# Patient Record
Sex: Male | Born: 1960 | Race: Black or African American | Hispanic: No | Marital: Single
Health system: Southern US, Community
[De-identification: ages and names within clinical notes are randomized; demographics above are authoritative.]

## PROBLEM LIST (undated history)

## (undated) DIAGNOSIS — K279 Peptic ulcer, site unspecified, unspecified as acute or chronic, without hemorrhage or perforation: Secondary | ICD-10-CM

---

## 1998-07-02 ENCOUNTER — Emergency Department (HOSPITAL_COMMUNITY): Admission: EM | Admit: 1998-07-02 | Discharge: 1998-07-02 | Payer: Self-pay | Admitting: Emergency Medicine

## 1998-07-08 ENCOUNTER — Emergency Department (HOSPITAL_COMMUNITY): Admission: EM | Admit: 1998-07-08 | Discharge: 1998-07-08 | Payer: Self-pay | Admitting: Emergency Medicine

## 1998-11-09 ENCOUNTER — Emergency Department (HOSPITAL_COMMUNITY): Admission: EM | Admit: 1998-11-09 | Discharge: 1998-11-09 | Payer: Self-pay | Admitting: Emergency Medicine

## 1998-11-09 ENCOUNTER — Encounter: Payer: Self-pay | Admitting: Emergency Medicine

## 2000-07-11 ENCOUNTER — Emergency Department (HOSPITAL_COMMUNITY): Admission: EM | Admit: 2000-07-11 | Discharge: 2000-07-11 | Payer: Self-pay | Admitting: Emergency Medicine

## 2000-07-11 ENCOUNTER — Encounter: Payer: Self-pay | Admitting: Emergency Medicine

## 2006-12-17 ENCOUNTER — Emergency Department (HOSPITAL_COMMUNITY): Admission: EM | Admit: 2006-12-17 | Discharge: 2006-12-17 | Payer: Self-pay | Admitting: Emergency Medicine

## 2007-07-21 ENCOUNTER — Emergency Department (HOSPITAL_COMMUNITY): Admission: EM | Admit: 2007-07-21 | Discharge: 2007-07-21 | Payer: Self-pay | Admitting: Emergency Medicine

## 2009-01-03 ENCOUNTER — Emergency Department (HOSPITAL_COMMUNITY): Admission: EM | Admit: 2009-01-03 | Discharge: 2009-01-03 | Payer: Self-pay | Admitting: Emergency Medicine

## 2009-01-20 ENCOUNTER — Ambulatory Visit: Payer: Self-pay | Admitting: Critical Care Medicine

## 2009-01-20 ENCOUNTER — Inpatient Hospital Stay (HOSPITAL_COMMUNITY): Admission: EM | Admit: 2009-01-20 | Discharge: 2009-01-24 | Payer: Self-pay | Admitting: Emergency Medicine

## 2009-01-24 ENCOUNTER — Encounter: Payer: Self-pay | Admitting: Internal Medicine

## 2009-01-28 DIAGNOSIS — J189 Pneumonia, unspecified organism: Secondary | ICD-10-CM

## 2009-01-28 DIAGNOSIS — Z9189 Other specified personal risk factors, not elsewhere classified: Secondary | ICD-10-CM | POA: Insufficient documentation

## 2009-01-28 DIAGNOSIS — D539 Nutritional anemia, unspecified: Secondary | ICD-10-CM | POA: Insufficient documentation

## 2009-01-28 DIAGNOSIS — F1010 Alcohol abuse, uncomplicated: Secondary | ICD-10-CM | POA: Insufficient documentation

## 2009-01-28 DIAGNOSIS — F1910 Other psychoactive substance abuse, uncomplicated: Secondary | ICD-10-CM

## 2009-01-28 DIAGNOSIS — K279 Peptic ulcer, site unspecified, unspecified as acute or chronic, without hemorrhage or perforation: Secondary | ICD-10-CM | POA: Insufficient documentation

## 2009-01-28 DIAGNOSIS — F17200 Nicotine dependence, unspecified, uncomplicated: Secondary | ICD-10-CM

## 2009-01-29 ENCOUNTER — Ambulatory Visit: Payer: Self-pay | Admitting: Pulmonary Disease

## 2009-01-29 DIAGNOSIS — J852 Abscess of lung without pneumonia: Secondary | ICD-10-CM | POA: Insufficient documentation

## 2009-02-11 ENCOUNTER — Ambulatory Visit: Payer: Self-pay | Admitting: Pulmonary Disease

## 2009-02-14 ENCOUNTER — Encounter: Payer: Self-pay | Admitting: Pulmonary Disease

## 2009-06-18 ENCOUNTER — Emergency Department (HOSPITAL_COMMUNITY): Admission: EM | Admit: 2009-06-18 | Discharge: 2009-06-18 | Payer: Self-pay | Admitting: Emergency Medicine

## 2010-05-18 ENCOUNTER — Emergency Department (HOSPITAL_COMMUNITY)
Admission: EM | Admit: 2010-05-18 | Discharge: 2010-05-19 | Disposition: A | Discharge status: Other Acute Inpatient Hospital | Payer: Self-pay | Source: Home / Self Care | Admitting: Emergency Medicine

## 2010-05-19 ENCOUNTER — Inpatient Hospital Stay (HOSPITAL_COMMUNITY)
Admission: AD | Admit: 2010-05-19 | Discharge: 2010-05-23 | Payer: Self-pay | Source: Home / Self Care | Attending: Psychiatry | Admitting: Psychiatry

## 2010-08-10 LAB — HEPATIC FUNCTION PANEL
Alkaline Phosphatase: 48 U/L (ref 39–117)
Bilirubin, Direct: 0.1 mg/dL (ref 0.0–0.3)
Total Bilirubin: 0.7 mg/dL (ref 0.3–1.2)

## 2010-08-10 LAB — BASIC METABOLIC PANEL
BUN: 6 mg/dL (ref 6–23)
CO2: 24 mEq/L (ref 19–32)
Chloride: 98 mEq/L (ref 96–112)
GFR calc non Af Amer: >60 mL/min (ref >60)
Glucose, Bld: 106 mg/dL — ABNORMAL HIGH (ref 70–99)
Potassium: 4.1 mEq/L (ref 3.5–5.1)

## 2010-08-10 LAB — RAPID URINE DRUG SCREEN, HOSP PERFORMED
Cocaine: POSITIVE — AB
Opiates: NOT DETECTED
Tetrahydrocannabinol: POSITIVE — AB

## 2010-08-10 LAB — POCT CARDIAC MARKERS

## 2010-08-10 LAB — CBC
HCT: 43.4 % (ref 39.0–52.0)
MCH: 32.1 pg (ref 26.0–34.0)
MCHC: 34.6 g/dL (ref 30.0–36.0)
MCV: 92.9 fL (ref 78.0–100.0)
RDW: 14.5 % (ref 11.5–15.5)

## 2010-08-10 LAB — DIFFERENTIAL
Basophils Absolute: 0 10*3/uL (ref 0.0–0.1)
Basophils Relative: 0 % (ref 0–1)
Eosinophils Absolute: 0 10*3/uL (ref 0.0–0.7)
Eosinophils Relative: 0 % (ref 0–5)
Monocytes Absolute: 0.5 10*3/uL (ref 0.1–1.0)

## 2010-08-10 LAB — SALICYLATE LEVEL: Salicylate Lvl: <4 mg/dL (ref 2.8–20.0)

## 2010-08-16 LAB — BASIC METABOLIC PANEL
BUN: 12 mg/dL (ref 6–23)
CO2: 24 mEq/L (ref 19–32)
GFR calc non Af Amer: >60 mL/min (ref >60)
Glucose, Bld: 108 mg/dL — ABNORMAL HIGH (ref 70–99)
Potassium: 3.4 mEq/L — ABNORMAL LOW (ref 3.5–5.1)

## 2010-08-16 LAB — URINE CULTURE
Colony Count: NO GROWTH
Culture: NO GROWTH

## 2010-08-16 LAB — CULTURE, BLOOD (ROUTINE X 2): Culture: NO GROWTH

## 2010-08-16 LAB — URINALYSIS, ROUTINE W REFLEX MICROSCOPIC
Bilirubin Urine: NEGATIVE
Hgb urine dipstick: NEGATIVE
Nitrite: NEGATIVE
Specific Gravity, Urine: 1.04 — ABNORMAL HIGH (ref 1.005–1.030)
pH: 6 (ref 5.0–8.0)

## 2010-08-16 LAB — DIFFERENTIAL
Basophils Absolute: 0 10*3/uL (ref 0.0–0.1)
Basophils Relative: 0 % (ref 0–1)
Eosinophils Absolute: 0 10*3/uL (ref 0.0–0.7)
Eosinophils Relative: 0 % (ref 0–5)
Lymphocytes Relative: 12 % (ref 12–46)
Monocytes Absolute: 0.6 10*3/uL (ref 0.1–1.0)

## 2010-08-16 LAB — CBC
HCT: 41.2 % (ref 39.0–52.0)
Hemoglobin: 13.8 g/dL (ref 13.0–17.0)
MCHC: 33.5 g/dL (ref 30.0–36.0)
Platelets: 233 10*3/uL (ref 150–400)
RDW: 15.3 % (ref 11.5–15.5)

## 2010-08-16 LAB — URINE MICROSCOPIC-ADD ON

## 2010-08-19 IMAGING — CR DG CHEST 2V
2 series · 2 of 2 positions shown · non-contrast
Comparison: None

CLINICAL DATA: Preadmission.  Elevated white count.  Cough.  Fever.
Bronchitis.  Smoker.

CHEST - 2 VIEW

[w chest pa]
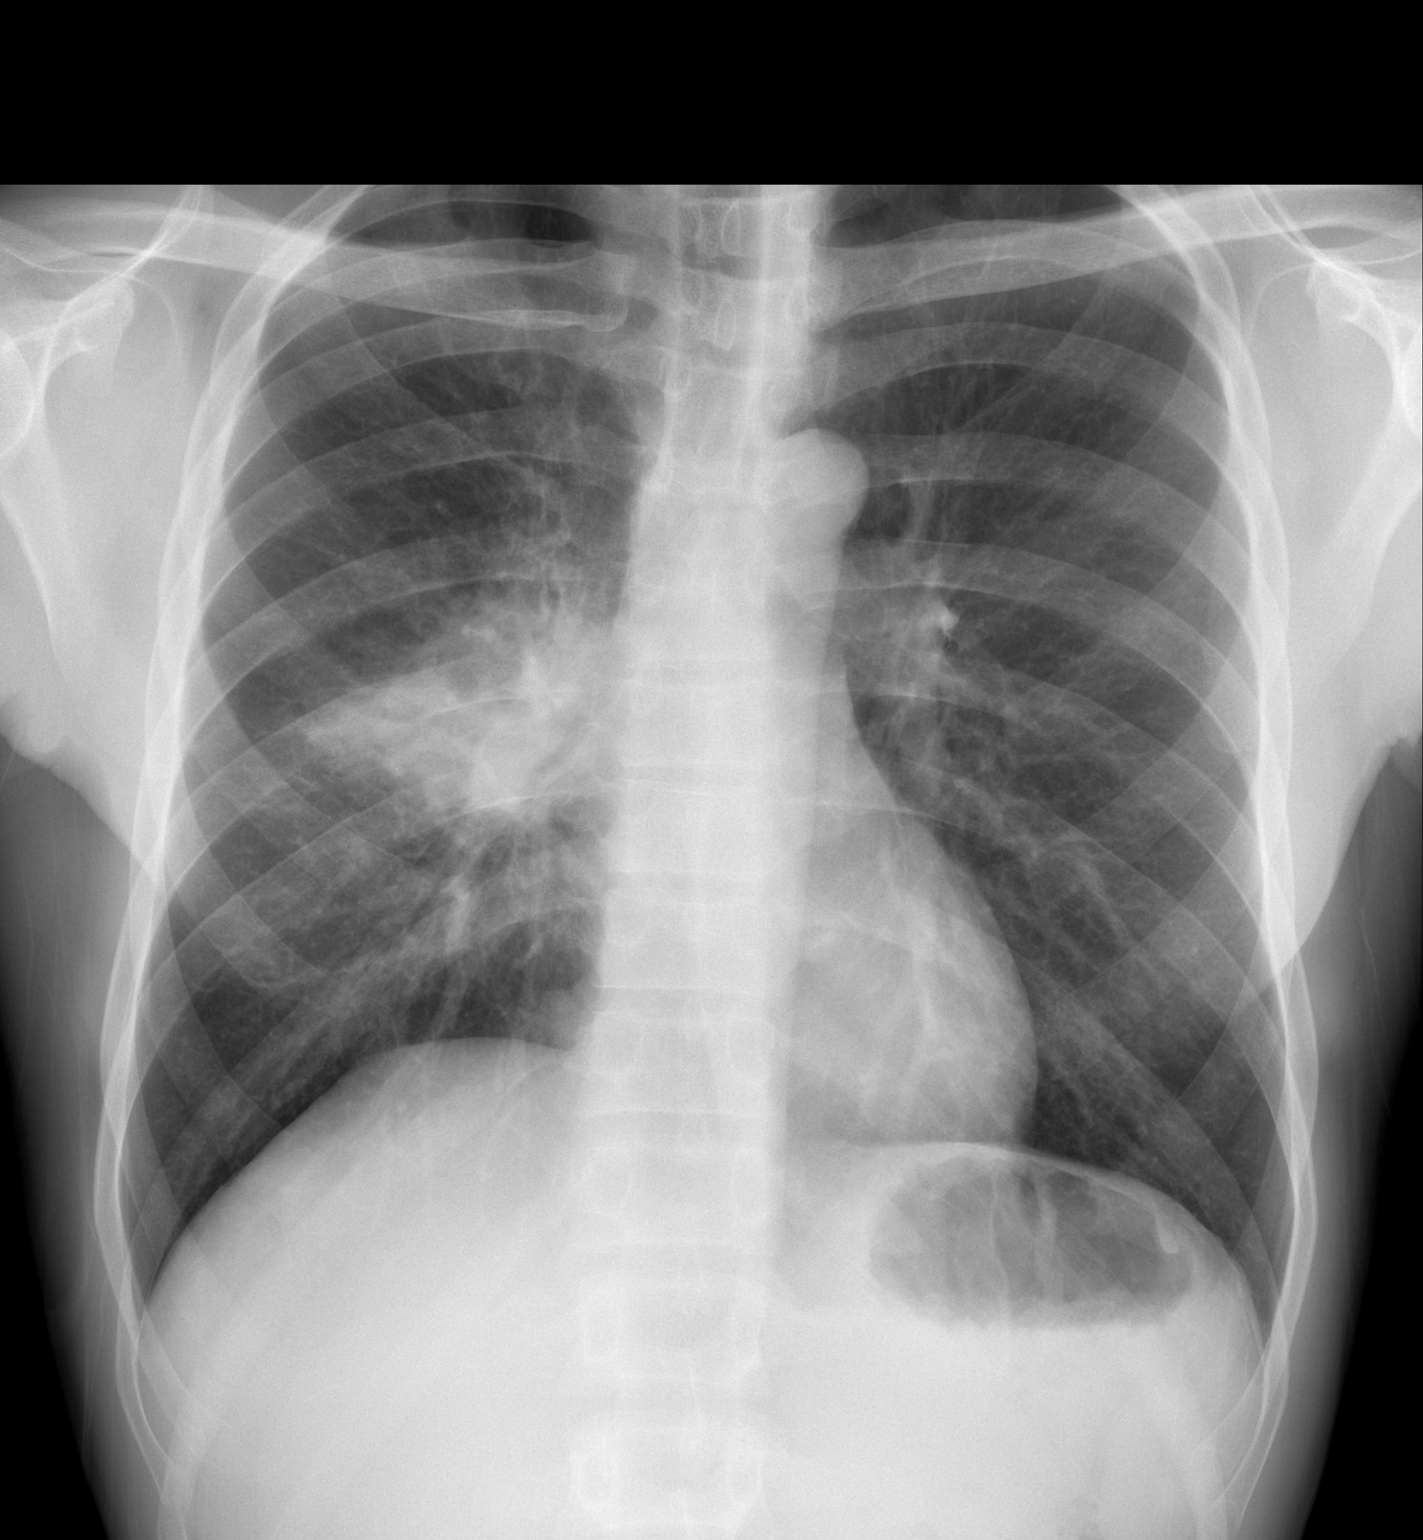

[w chest lat]
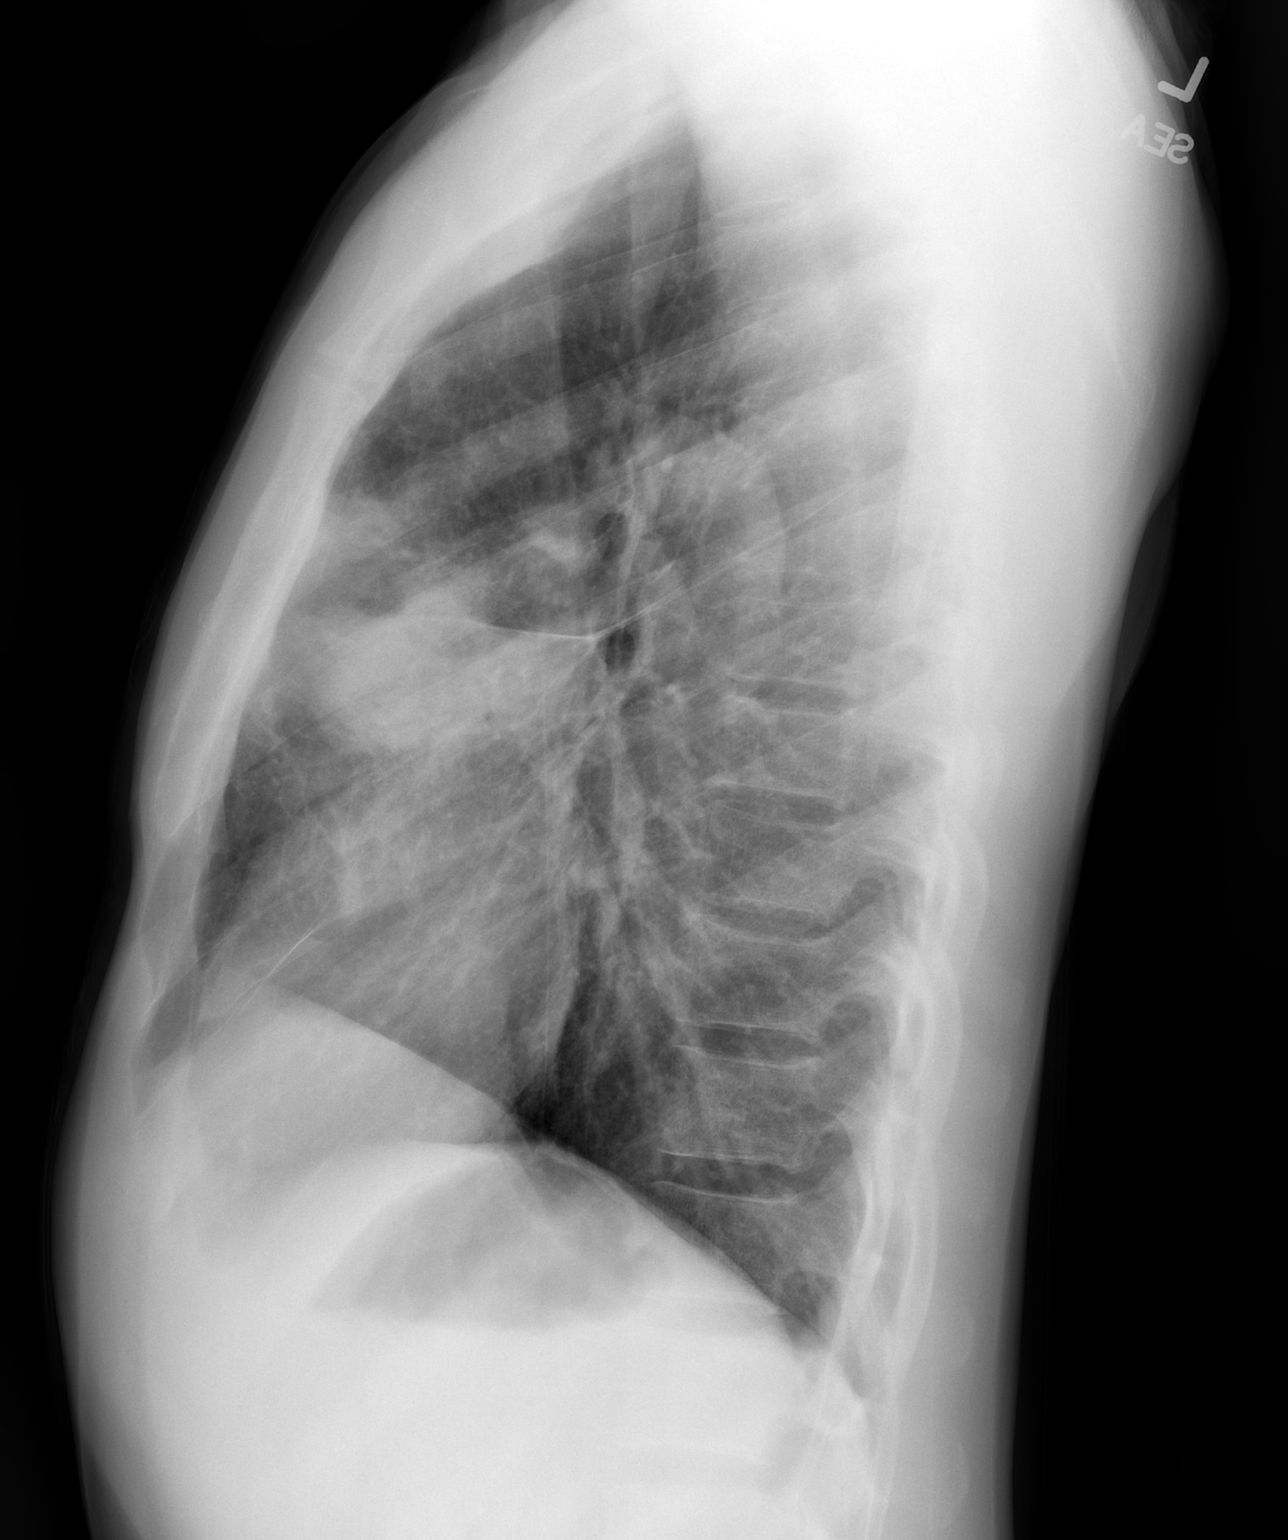

[2 of 2 positions shown; findings below may reference images not displayed]

FINDINGS: Homogeneous opacity anteromedially in the right mid
chest.  Main considerations are a pneumonic consolidation versus
tumor.

Normal cardiac size and shape.  No pleural effusion.  Bony thorax
is intact.
IMPRESSION: Right middle and upper lobe opacity of indeterminate etiology.
Although this may represent pneumonia with consolidation, tumor
cannot be excluded at this point.  Recommend follow-up CXR to
assess for complete clearing.

## 2010-08-20 IMAGING — CT CT CHEST W/O CM
3 series · 18 of 29 positions shown, 19 images · non-contrast
Comparison: Chest radiographs 01/20/2009.

CLINICAL DATA: Chest pain with fever and leukocytosis.

CT CHEST WITHOUT CONTRAST
TECHNIQUE: Multidetector CT imaging of the chest was performed
following the standard protocol without IV contrast.

[Series 2: routine chest · axial · 0.70mm/px · z∈[-226,-66]mm · 4 of 66 slices shown, 5 images]
[im 17/66  mediastinal]
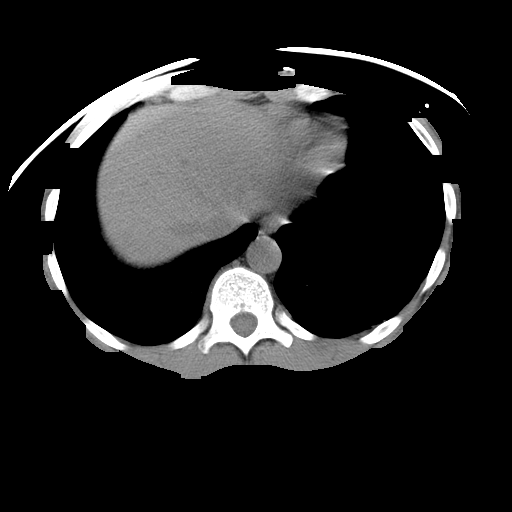
[im 17/66  lung]
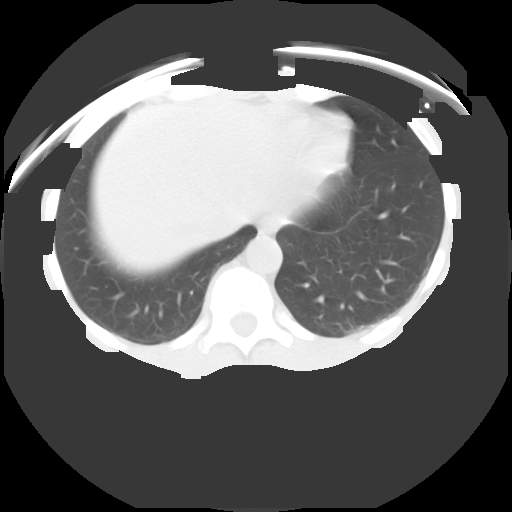
[im 33/66  lung]
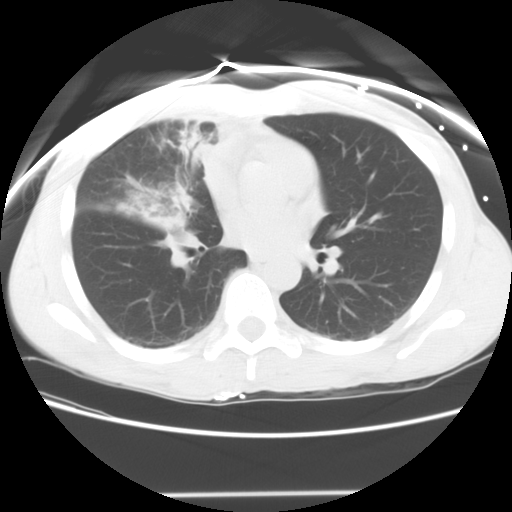
[im 34/66  lung]
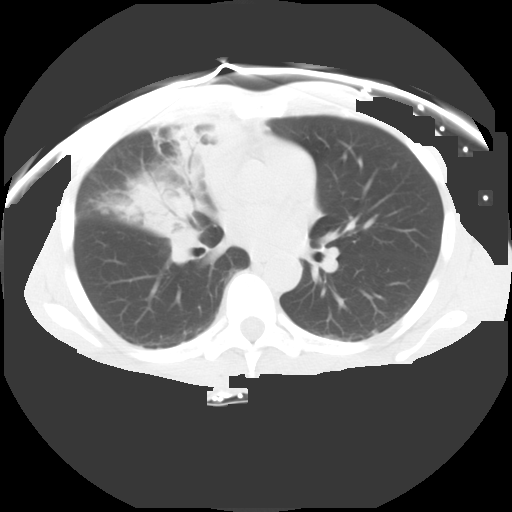
[im 49/66  lung]
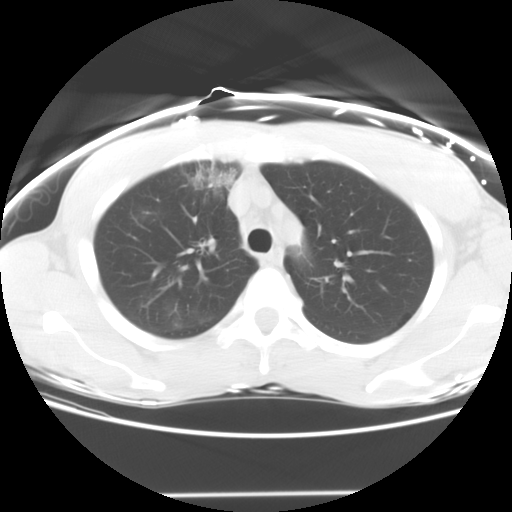

[Series 400: reformatted · sagittal · 0.70mm/px · 8 of 171 slices shown (1 of 2)]
[im 15/171  lung]
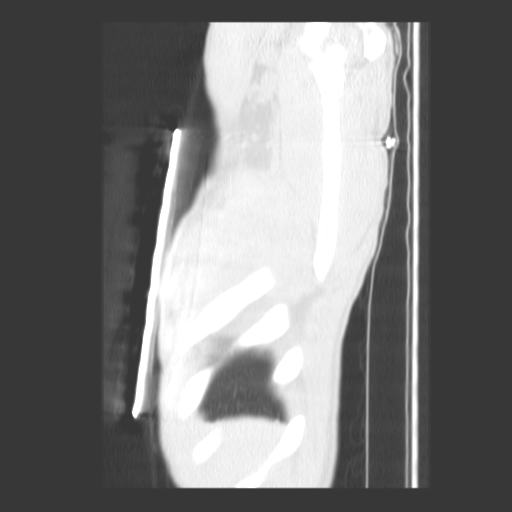
[im 43/171  lung]
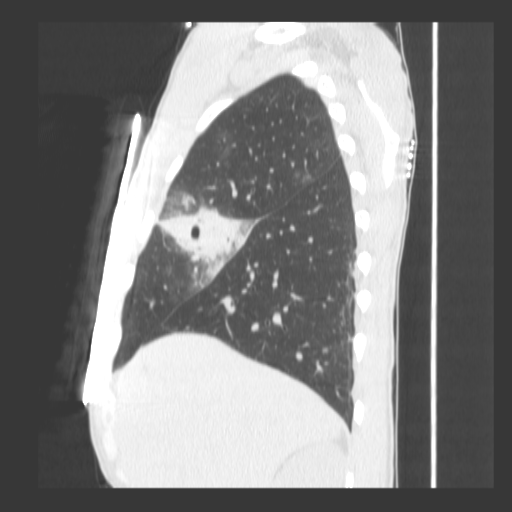
[im 57/171  lung]
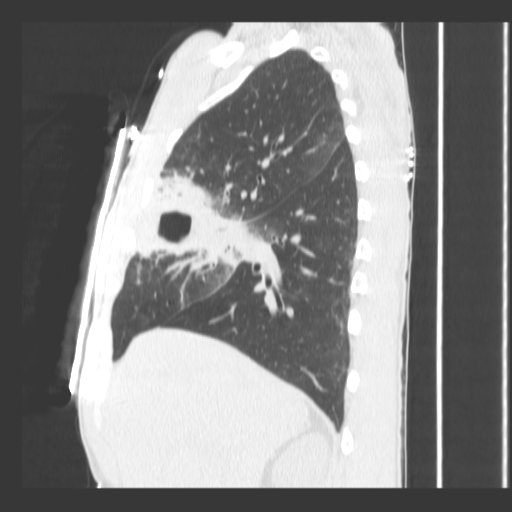
[im 71/171  lung]
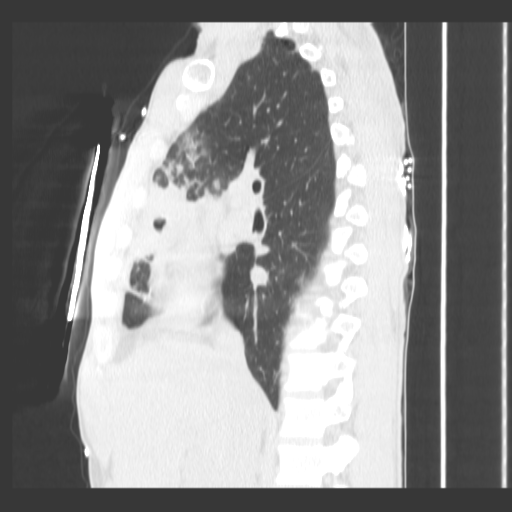
[im 100/171  lung]
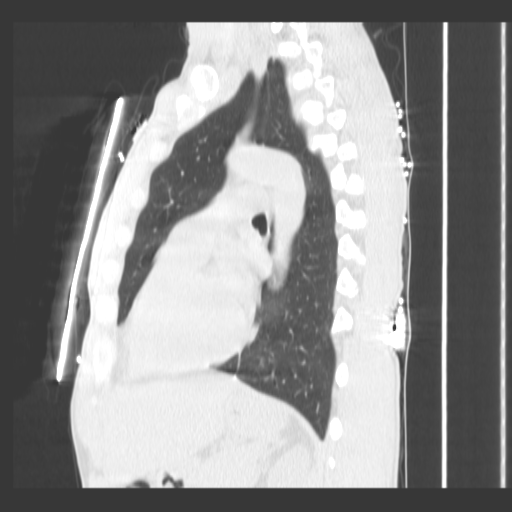
[im 114/171  lung]
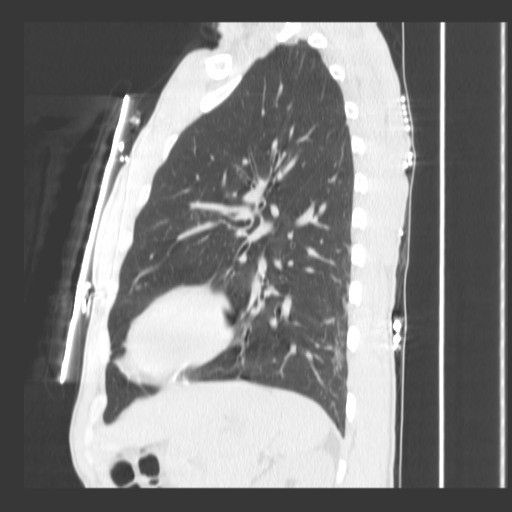
[im 128/171  lung]
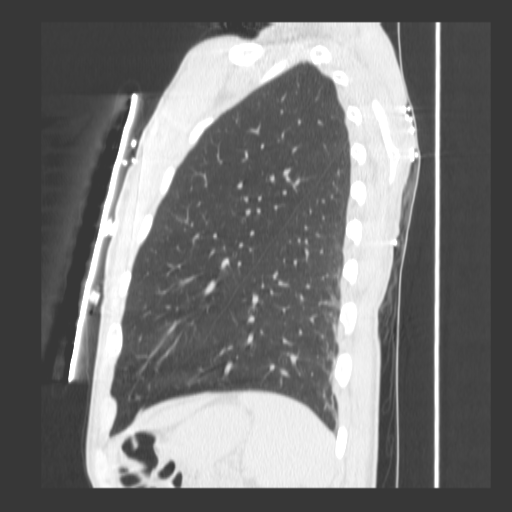
[im 156/171  lung]
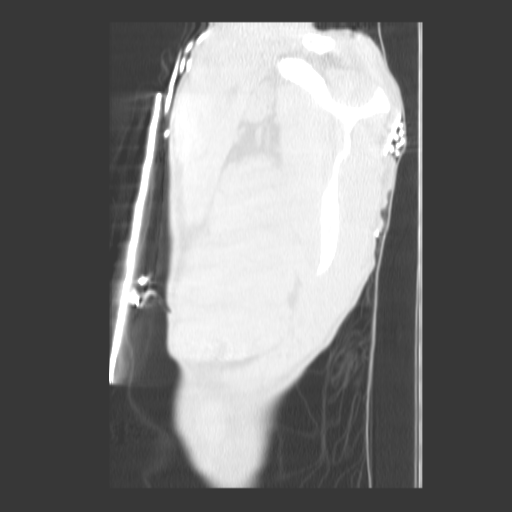

[Series 401: reformatted · coronal · 0.70mm/px · 6 of 128 slices shown (2 of 2)]
[im 15/128  lung]
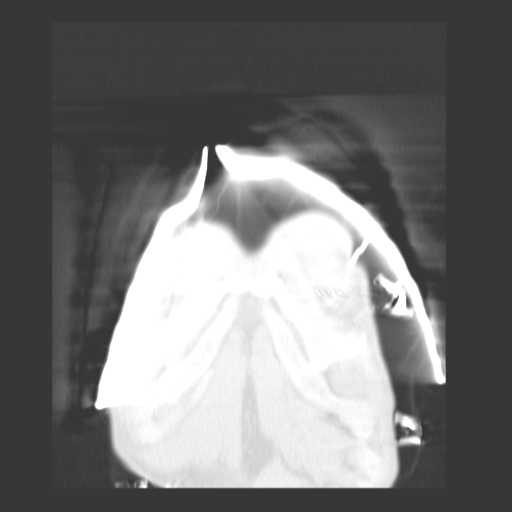
[im 29/128  lung]
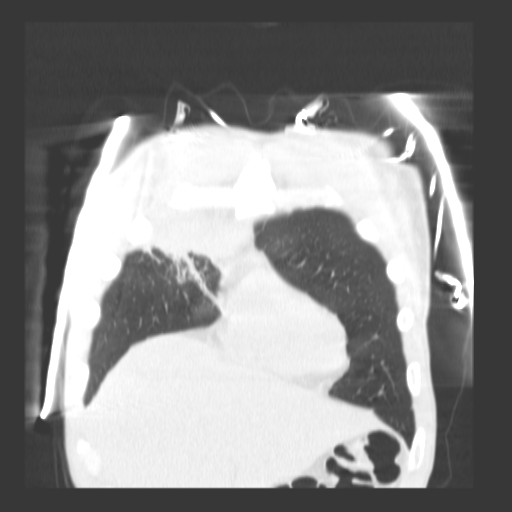
[im 43/128  lung]
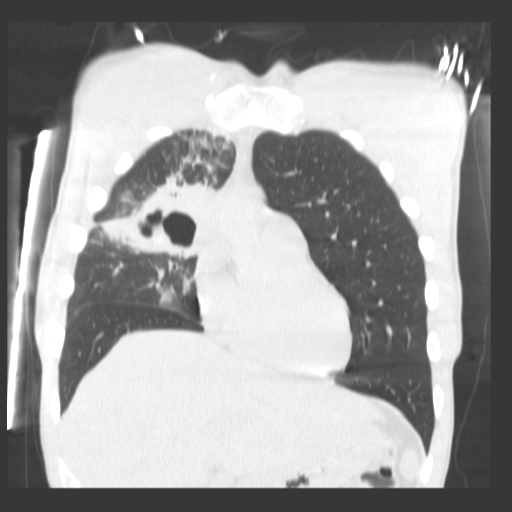
[im 57/128  lung]
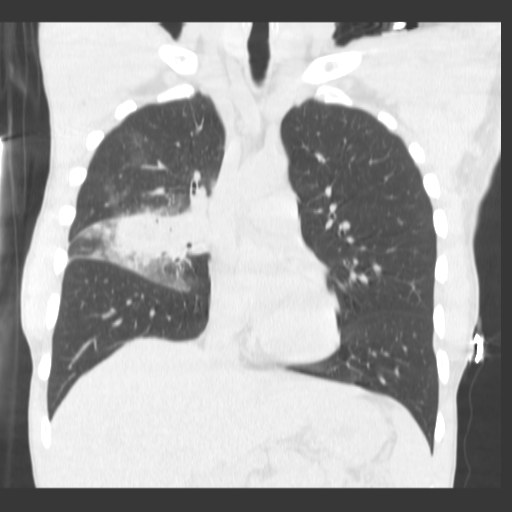
[im 71/128  lung]
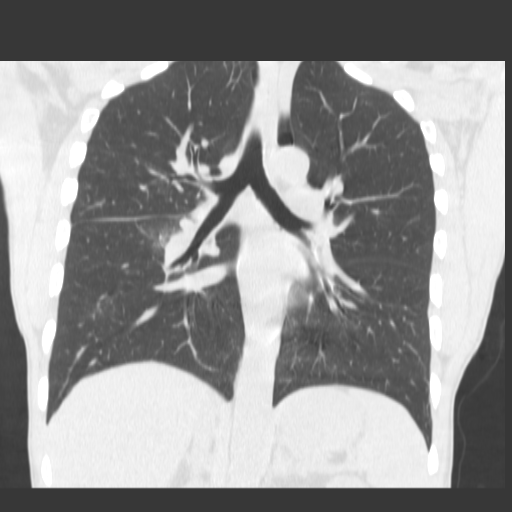
[im 85/128  lung]
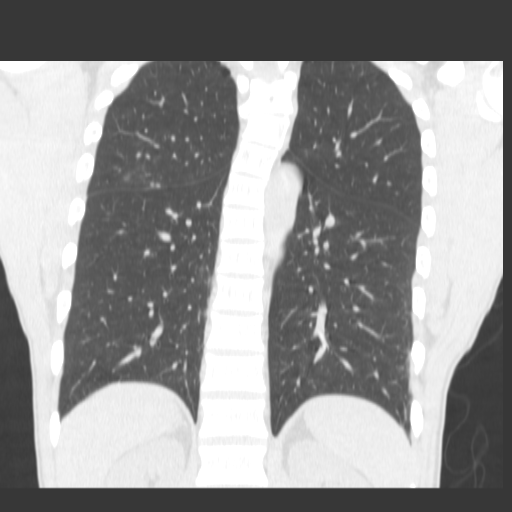

[18 of 29 positions shown; findings below may reference images not displayed]

FINDINGS: As demonstrated on the preceding radiographs, there is
dense air space disease with air bronchograms in the right upper
and middle lobes.  There is associated cavitation with a 6 cm air
containing cavity on image 30.  This process abuts the anterior
chest wall and right aspect of the mediastinum.  There is no chest
wall invasion.  Its peripheral margins are ill defined, and there
are patchy adjacent airspace opacities elsewhere in the right lung.
There is a small cavitary lesion in the right lower lobe.  The left
lung demonstrates no significant findings.

Hilar assessment is limited without intravenous contrast.  There is
probable adjacent right hilar adenopathy.  There is a pre carinal
node measuring 1 cm short axis on image 22.  No significant pleural
or pericardial effusion is present.

The imaged upper abdomen demonstrates no suspicious findings.  The
stomach is poorly distended and suboptimally evaluated.
IMPRESSION: 1.  Dominant cavitary process in the right upper and middle lobes
most likely reflects pneumonia and lung abscess formation in this
clinical context.  Adjacent patchy airspace opacities are present
elsewhere in the right lung, and there is an additional small
cavitary lesion in the right lower lobe.
2.  Because the dominant process crosses the minor fissure,
cavitary neoplasm is not excluded.
3.  Nonspecific right hilar and precarinal lymph nodes, probably
reactive.

## 2010-08-22 IMAGING — CR DG CHEST 2V
2 series · 2 of 2 positions shown · non-contrast
Comparison: 01/20/2009 study.  ET 01/21/2009.

CLINICAL DATA: History given of pneumonia.

CHEST - 2 VIEW

[w chest pa]
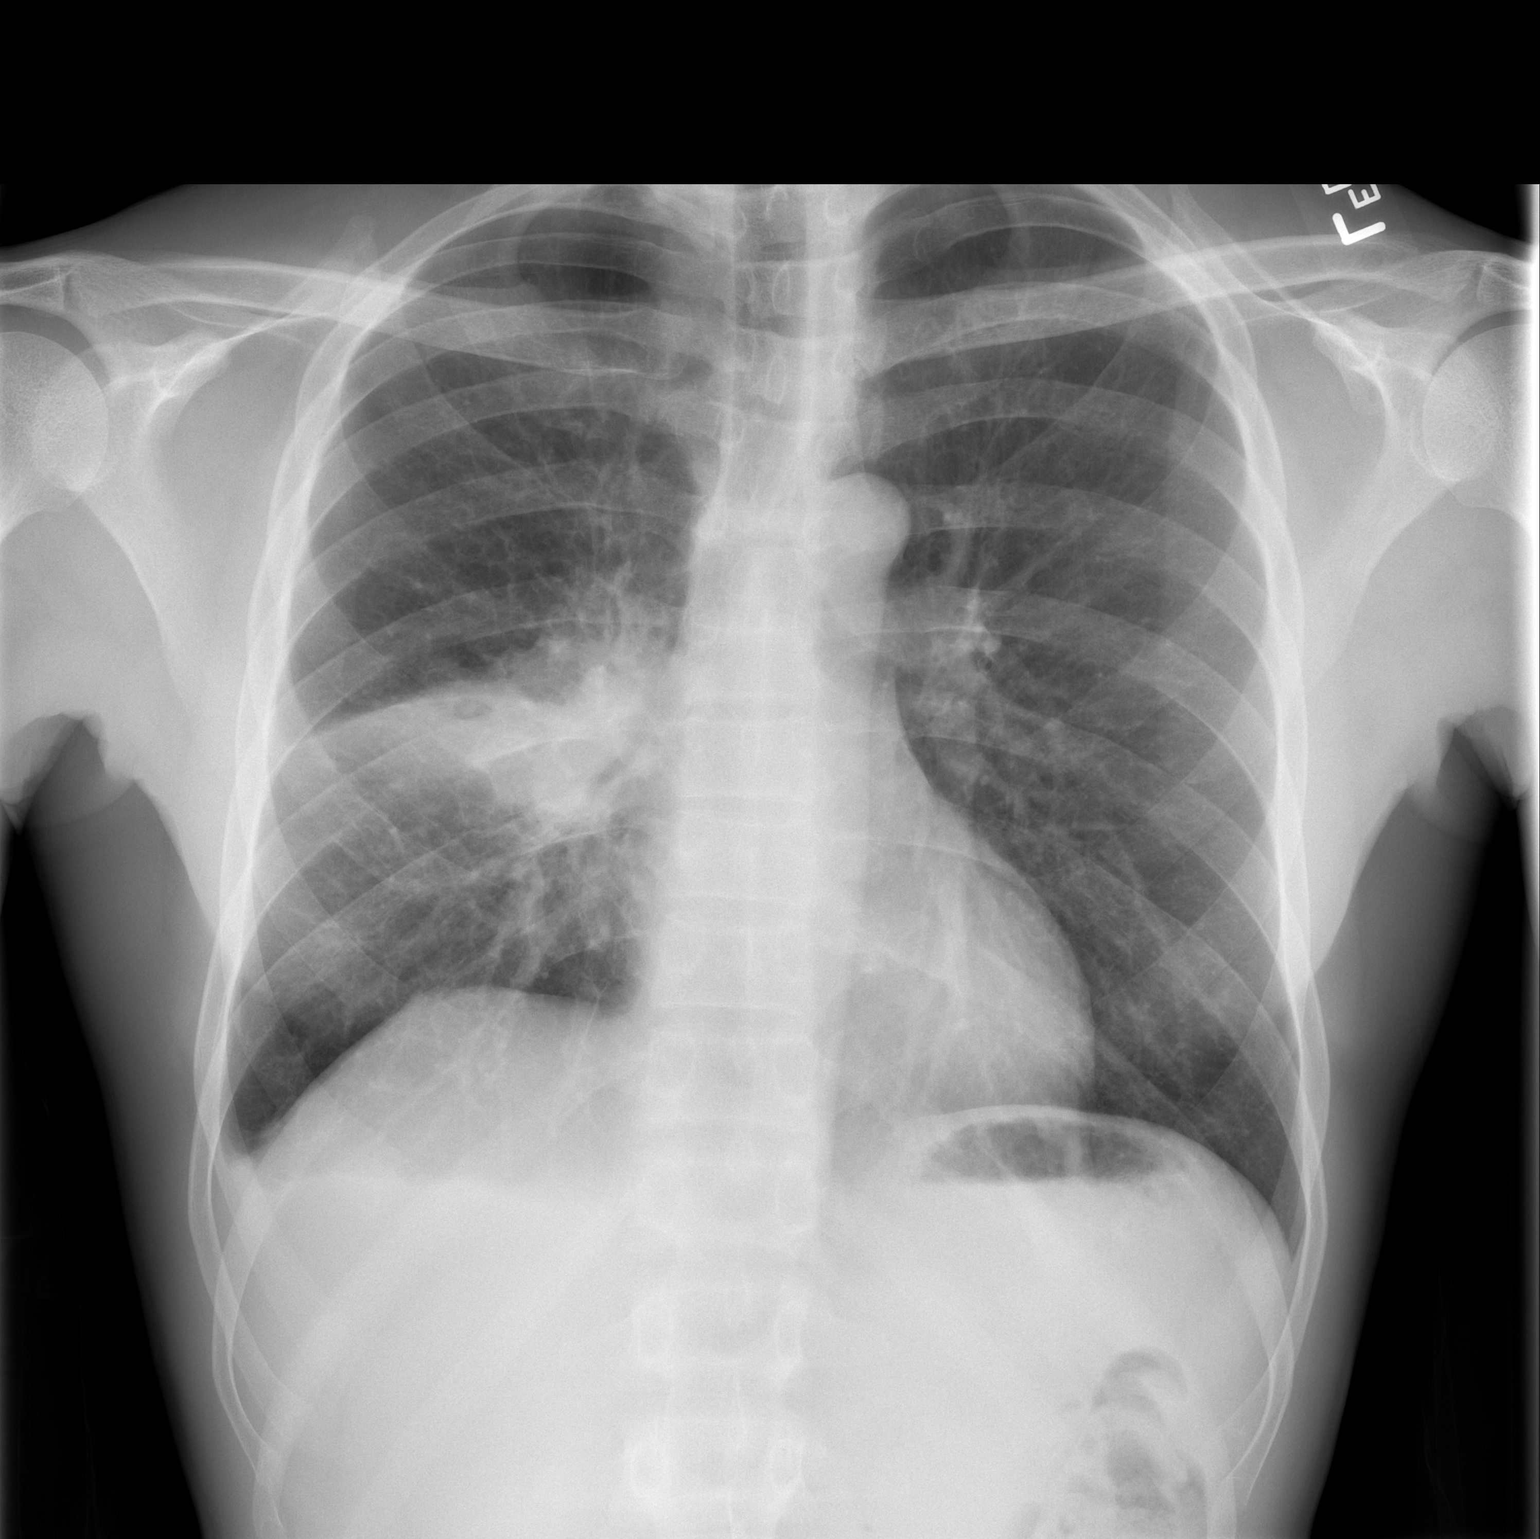

[w chest lat]
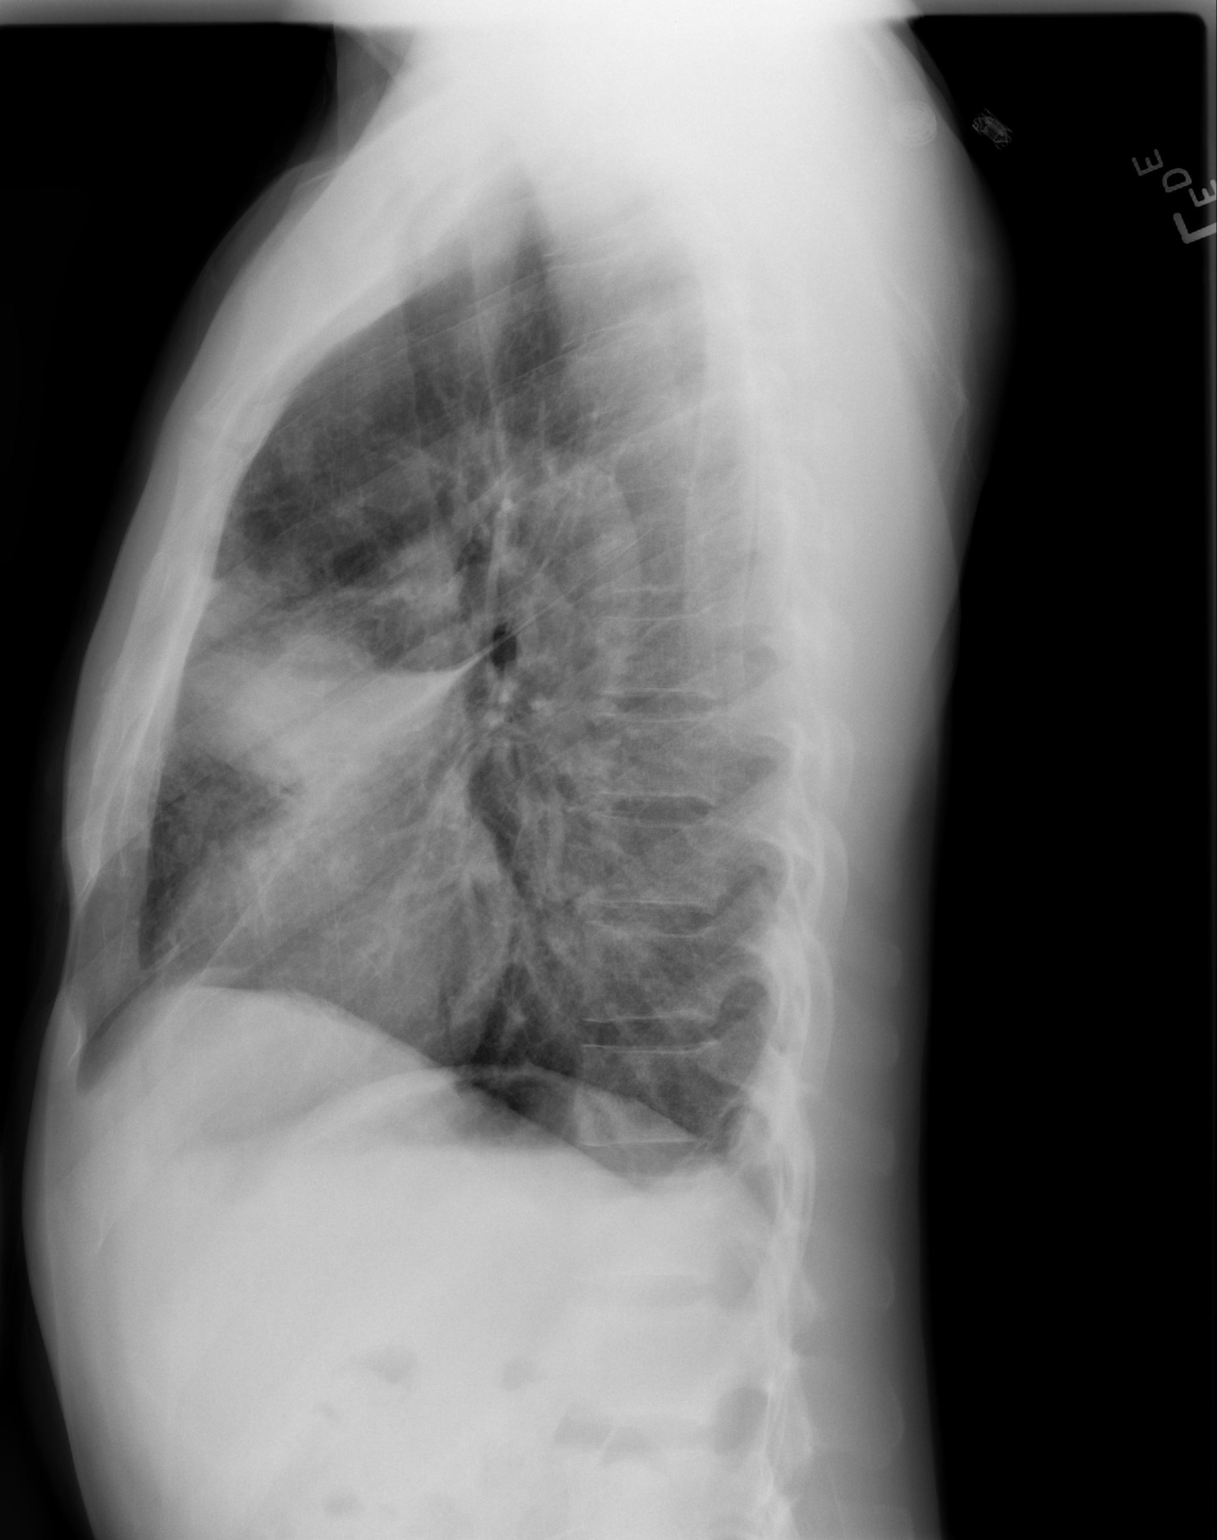

[2 of 2 positions shown; findings below may reference images not displayed]

FINDINGS: Abnormal opacity is again evident within the right
perihilar region and superior anterior aspect of the thorax
involving superior aspect of right middle lobe and extending into
the upper lobe on the right. No clearing has occurred since
previous study. There appears to be a small cavitary area within
the mass or infiltrate.  No other pulmonary infiltrative densities
are evident.  No pleural effusions are seen. The cardiac silhouette
is normal size and shape. Bones appear average for age.
IMPRESSION: Persistent abnormal pulmonary infiltrative density or mass is seen
with small area of cavitation.  No clearing has occurred since
previous study of 01/21/2009.  This may reflect pneumonia with
adenopathy and cavitation or small lung abscess.  I cannot exclude
malignancy.

## 2010-09-05 LAB — LEGIONELLA PROFILE(CULTURE+DFA/SMEAR): Legionella Antigen (DFA): NEGATIVE

## 2010-09-05 LAB — DIFFERENTIAL
Basophils Absolute: 0 10*3/uL (ref 0.0–0.1)
Basophils Relative: 0 % (ref 0–1)
Eosinophils Relative: 1 % (ref 0–5)
Monocytes Absolute: 1.5 10*3/uL — ABNORMAL HIGH (ref 0.1–1.0)
Neutro Abs: 13.2 10*3/uL — ABNORMAL HIGH (ref 1.7–7.7)

## 2010-09-05 LAB — BASIC METABOLIC PANEL
BUN: 5 mg/dL — ABNORMAL LOW (ref 6–23)
CO2: 26 mEq/L (ref 19–32)
CO2: 27 mEq/L (ref 19–32)
Calcium: 8.4 mg/dL (ref 8.4–10.5)
Calcium: 8.4 mg/dL (ref 8.4–10.5)
Chloride: 102 mEq/L (ref 96–112)
Chloride: 102 mEq/L (ref 96–112)
Creatinine, Ser: 0.76 mg/dL (ref 0.4–1.5)
Creatinine, Ser: 0.79 mg/dL (ref 0.4–1.5)
Glucose, Bld: 95 mg/dL (ref 70–99)
Glucose, Bld: 99 mg/dL (ref 70–99)
Potassium: 3.5 mEq/L (ref 3.5–5.1)
Sodium: 133 mEq/L — ABNORMAL LOW (ref 135–145)

## 2010-09-05 LAB — IRON AND TIBC
Saturation Ratios: 11 % — ABNORMAL LOW (ref 20–55)
TIBC: 171 ug/dL — ABNORMAL LOW (ref 215–435)
UIBC: 152 ug/dL

## 2010-09-05 LAB — CARDIAC PANEL(CRET KIN+CKTOT+MB+TROPI)
CK, MB: 3.1 ng/mL (ref 0.3–4.0)
Relative Index: 0.6 (ref 0.0–2.5)
Relative Index: 0.9 (ref 0.0–2.5)
Troponin I: 0.02 ng/mL (ref 0.00–0.06)

## 2010-09-05 LAB — OPIATE, QUANTITATIVE, URINE
Codeine Urine: 2300 ng/mL
Hydrocodone: NEGATIVE ng/mL
Hydromorphone GC/MS Conf: NEGATIVE ng/mL
Oxymorphone: NEGATIVE ng/mL

## 2010-09-05 LAB — CULTURE, RESPIRATORY W GRAM STAIN

## 2010-09-05 LAB — FOLATE RBC: RBC Folate: 455 ng/mL (ref 180–600)

## 2010-09-05 LAB — AFB CULTURE WITH SMEAR (NOT AT ARMC): Acid Fast Smear: NONE SEEN

## 2010-09-05 LAB — COMPREHENSIVE METABOLIC PANEL
Albumin: 2.7 g/dL — ABNORMAL LOW (ref 3.5–5.2)
Alkaline Phosphatase: 69 U/L (ref 39–117)
BUN: 9 mg/dL (ref 6–23)
CO2: 30 mEq/L (ref 19–32)
Chloride: 98 mEq/L (ref 96–112)
GFR calc non Af Amer: >60 mL/min (ref >60)
Glucose, Bld: 101 mg/dL — ABNORMAL HIGH (ref 70–99)
Potassium: 3.7 mEq/L (ref 3.5–5.1)
Total Bilirubin: 0.6 mg/dL (ref 0.3–1.2)

## 2010-09-05 LAB — FUNGUS CULTURE W SMEAR

## 2010-09-05 LAB — CK TOTAL AND CKMB (NOT AT ARMC): Relative Index: 1 (ref 0.0–2.5)

## 2010-09-05 LAB — CBC
HCT: 31.3 % — ABNORMAL LOW (ref 39.0–52.0)
Hemoglobin: 10.6 g/dL — ABNORMAL LOW (ref 13.0–17.0)
Hemoglobin: 12.5 g/dL — ABNORMAL LOW (ref 13.0–17.0)
MCHC: 33.6 g/dL (ref 30.0–36.0)
MCHC: 33.8 g/dL (ref 30.0–36.0)
MCHC: 34.1 g/dL (ref 30.0–36.0)
MCV: 93.8 fL (ref 78.0–100.0)
MCV: 94.5 fL (ref 78.0–100.0)
RBC: 3.34 MIL/uL — ABNORMAL LOW (ref 4.22–5.81)
RBC: 3.91 MIL/uL — ABNORMAL LOW (ref 4.22–5.81)
RDW: 13.9 % (ref 11.5–15.5)
RDW: 14.1 % (ref 11.5–15.5)
WBC: 10.9 10*3/uL — ABNORMAL HIGH (ref 4.0–10.5)
WBC: 16.2 10*3/uL — ABNORMAL HIGH (ref 4.0–10.5)

## 2010-09-05 LAB — LEGIONELLA ANTIGEN, URINE

## 2010-09-05 LAB — DRUGS OF ABUSE SCREEN W/O ALC, ROUTINE URINE
Barbiturate Quant, Ur: NEGATIVE
Benzodiazepines.: NEGATIVE
Creatinine,U: 311.3 mg/dL
Methadone: NEGATIVE
Propoxyphene: NEGATIVE

## 2010-09-05 LAB — CULTURE, BLOOD (ROUTINE X 2): Culture: NO GROWTH

## 2010-09-05 LAB — HEMOGLOBIN A1C
Hgb A1c MFr Bld: 5.4 % (ref 4.6–6.1)
Mean Plasma Glucose: 108 mg/dL

## 2010-09-05 LAB — FERRITIN: Ferritin: 417 ng/mL — ABNORMAL HIGH (ref 22–322)

## 2010-09-05 LAB — LIPID PANEL: VLDL: 12 mg/dL (ref 0–40)

## 2010-09-05 LAB — PNEUMOCYSTIS JIROVECI SMEAR BY DFA: Pneumocystis jiroveci Ag: NOT DETECTED

## 2010-09-05 LAB — TROPONIN I: Troponin I: 0.01 ng/mL (ref 0.00–0.06)

## 2010-09-05 LAB — RETICULOCYTES: Retic Ct Pct: 0.8 % (ref 0.4–3.1)

## 2010-09-06 LAB — CBC
MCHC: 33.7 g/dL (ref 30.0–36.0)
MCV: 94.9 fL (ref 78.0–100.0)
Platelets: 339 10*3/uL (ref 150–400)
RBC: 5.04 MIL/uL (ref 4.22–5.81)

## 2010-09-06 LAB — DIFFERENTIAL
Eosinophils Absolute: 0 10*3/uL (ref 0.0–0.7)
Eosinophils Relative: 0 % (ref 0–5)
Lymphocytes Relative: 4 % — ABNORMAL LOW (ref 12–46)
Lymphs Abs: 0.8 10*3/uL (ref 0.7–4.0)
Monocytes Absolute: 1.2 10*3/uL — ABNORMAL HIGH (ref 0.1–1.0)
Monocytes Relative: 7 % (ref 3–12)

## 2010-09-06 LAB — COMPREHENSIVE METABOLIC PANEL
ALT: 16 U/L (ref 0–53)
AST: 29 U/L (ref 0–37)
Albumin: 4.2 g/dL (ref 3.5–5.2)
CO2: 24 mEq/L (ref 19–32)
Calcium: 9.8 mg/dL (ref 8.4–10.5)
Creatinine, Ser: 2.18 mg/dL — ABNORMAL HIGH (ref 0.4–1.5)
GFR calc Af Amer: 39 mL/min — ABNORMAL LOW (ref >60)
Sodium: 138 mEq/L (ref 135–145)
Total Protein: 9.3 g/dL — ABNORMAL HIGH (ref 6.0–8.3)

## 2010-09-06 LAB — LIPASE, BLOOD: Lipase: 15 U/L (ref 11–59)
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
## 2010-10-13 NOTE — Consult Note (Signed)
NAME:  Gary Guerrero, Gary Guerrero              ACCOUNT NO.:  [id number]   MEDICAL RECORD NO.:  [id number]          PATIENT TYPE:  INP   LOCATION:  6714                         FACILITY:  [facility]   PHYSICIAN:  Charlcie Cradle. Delford Field, MD, FCCPDATE OF BIRTH:  [birth year]   DATE OF CONSULTATION:  01/22/2009  DATE OF DISCHARGE:                                 CONSULTATION   LABORATORY DATA:  Chest x-ray showed right middle lobe cavitation and  consolidation.  CT scan of chest showed same.  EKG; normal sinus rhythm.  Sodium 133, potassium 3.5, chloride 102, CO2 of 26, BUN 5, creatinine  0.8, and blood sugar 119.  Micro:  Urine Legionella negative.  Urine  strep pneumoniae negative.  Blood cultures x2 negative.  White count of  10,900 and hemoglobin 10.6.   IMPRESSION:  Right middle lobe cavitary pneumonic infiltrates with  mediastinal and hilar adenopathy.  Sputum culture pending and blood  cultures pending.  Agree with current community-acquired pneumonia  coverage.  However, we would add clindamycin to cover potential  anaerobes as the patient has very poor dentition.  He will also need  bronchoscopy.  We will schedule same.      Charlcie Cradle Delford Field, MD, [facility]  Electronically Signed     PEW/MEDQ  D:  01/22/2009  T:  01/23/2009  Job:  [id number]   cc:   Pershing Proud, MD  Hospitalist Service

## 2010-10-13 NOTE — Op Note (Signed)
NAME:  Gary Guerrero, Gary Guerrero              ACCOUNT NO.:  [id number]   MEDICAL RECORD NO.:  [id number]           PATIENT TYPE:   LOCATION:                                 FACILITY:   PHYSICIAN:  Casimiro Needle B. Sherene Sires, MD, FCCPDATE OF BIRTH:  07-17-1960   DATE OF PROCEDURE:  DATE OF DISCHARGE:                               OPERATIVE REPORT   This is a fiber bronchoscopy with washings.   HISTORY AND INDICATIONS:  Please see handwritten notes.  The patient  agreed to the procedure and after full discussion of risks, benefits,  alternatives, a formal time-out was performed before initiating the  procedure today.   The patient was continuously monitored by surface ECG and oximetry and  maintained adequate saturations throughout the procedure.  He received a  total of about 75 mg of Demerol and 7.5 mg IV Versed for adequate  sedation and cough suppression.   The right naris was initially prepared with 2% lidocaine jelly.  An  additional 1% lidocaine updraft nebulizer was used.   Using a standard flexible fiber bronchoscope, the right naris was easily  cannulated with good visualization of entire pharynx and larynx.  The  cords moved normally and no apparent airway lesions.   Using additional 1% lidocaine as needed, the entire tracheobronchial  tree was explored bilaterally with the following findings.  All the  airways opened widely to the subsegmental level.  There was no evidence  of purulent or excessive secretions or endobronchial disease at all.   The right middle lobe was lavaged with adequate return.  The patient  tolerated the procedure well.   IMPRESSION:  Pneumonia of unclear etiology.   RECOMMENDATIONS:  Await washings.  Since he has bad dentition, I  recommended a total of 2 weeks of Augmentin therapy before he returns  for outpatient followup by Dr. Delford Field.      [name]. Sherene Sires, MD, [facility]  Electronically Signed     MBW/MEDQ  D:  01/25/2009  T:  01/26/2009  Job:  [id number]

## 2010-10-13 NOTE — Discharge Summary (Signed)
NAME:  Gary Guerrero, Gary Guerrero              ACCOUNT NO.:  [id number]   MEDICAL RECORD NO.:  [id number]          PATIENT TYPE:  INP   LOCATION:  6714                         FACILITY:  [facility]   PHYSICIAN:  Ruthy Dick, MD    DATE OF BIRTH:  Dec 02, 1960   DATE OF ADMISSION:  01/20/2009  DATE OF DISCHARGE:  01/24/2009                               DISCHARGE SUMMARY   REASON FOR ADMISSION:  Chest pain, shortness of breath and productive  cough.   FINAL DISCHARGE DIAGNOSES:  1. Cavitary pneumonia (community-acquired pneumonia).  2. Chronic anemia.  3. Polysubstance abuse.  4. Tobacco abuse and alcohol abuse.   HISTORY OF PRESENT ILLNESS:  This is a 50 year old male with no  significant past medical history except for smoking who came in with a  productive cough and right-sided chest pain.  He went to Dr. Pershing Proud who actually gave him Bactrim, which he took, without resolving  his symptoms.  Because of this, he came to the emergency room and was  found to have right lower lobe for cavitary pneumonia and was started on  Avelox in the hospital.  It was felt like community-acquired pneumonia,  but we also had in mind that the patient had a cavitary pneumonia.  Because of this, he was placed in Droplet isolation room.  We also  consulted Pulmonology and the patient had a bronchoscopy, which showed  that the patient has pneumonia, no evidence of mass.  Recommendation by  Pulmonology was the patient to be treated with Augmentin for 2 weeks.  The patient has improved very well clinically even though his imaging  findings remained significantly unchanged.  He has no more weakness.  His cough has improved tremendously, and he says he is breathing very  well and ready to go home.  His discharge has also been approved by the  pulmonologist and the patient would have outpatient followup for  imaging.  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  His bronchial  alveolar lavage lab work is not yet available and this will be followed  up in the outpatient setting.  Of note is the fact that HIV test was  done during this admission and was nonreactive.  His workup for anemia  was mostly negative except for low iron and slightly high ferritin.  TIBC was normal.  This pointed to possible chronic disease, but I am not  quite sure what kind of chronic disease he has.  His guaiac was not  readily available prior to discharge because the patient was slightly  constipated by the time it was ordered.  We appealed to the primary care  physician to follow up on the patient's anemia and to stratify and work  it up further with the stool studies.  The patient may actually benefit  from a colonoscopy since he is close to 50.  Today, the patient has no  complaint whatsoever.  No shortness  of breath, no abdominal pain, no  nausea, no vomiting, no diarrhea, no constipation, no palpitation, no  dysuria, no frequency, no syncope, no fever.   PHYSICAL EXAMINATION:  VITALS:  Temperature 98.3, pulse 70, respirations  18, blood pressure 107/76, and saturating 99% on room air.  CHEST:  Clear to auscultation bilaterally.  ABDOMEN:  Soft, nontender.  EXTREMITIES:  No clubbing, no cyanosis, no edema.  CARDIOVASCULAR:  First and second heart sounds only.  CENTRAL NERVOUS SYSTEM:  Nonfocal.   The patient is to follow with [facility].  An appointment has  been made for him to see Dr. Joelene Millin on January 29, 2009, at 10:45 a.m.  He is also to follow up with his primary care physician, Dr. Pershing Proud for further evaluation and workup.  He is to call for this  appointment and the appointment has to be scheduled in one week's time.   Total time used for discharge 45 minutes.   DISCHARGE MEDICATIONS:  1. Augmentin 875 mg p.o. b.i.d. for 2 weeks.  2. Mucinex  1200 mg p.o. b.i.d. for 1 week.  3. The patient is to stop taking Bactrim, which was given to him by      his primary care physician.   CONSULT DURING THIS ADMISSION:  Pulmonology consult.   PROCEDURES DONE DURING THIS ADMISSION:  1. CT scan of the chest without contrast, which showed a dominant      cavitary process in the right upper and middle lobes, most likely      reflects pneumonia and lung abscess formation in this clinical      context, adjacent patchy air space opacities are present in the      right lung and there is an additional small cavitary lesion in the      right lower lobe.  It was also noted that neoplasm could not be      excluded.  There were also some nonspecific right hilar and      precarinal lymph nodes, probably reactive.  2. Bronchoscopy was done and finding was that of pneumonia at      [facility].      Ruthy Dick, MD  Electronically Signed     GU/MEDQ  D:  01/24/2009  T:  01/25/2009  Job:  [id number]   cc:   Erline Levine  Dr. Pershing Proud
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
## 2011-01-12 ENCOUNTER — Emergency Department (HOSPITAL_COMMUNITY)
Admission: EM | Admit: 2011-01-12 | Discharge: 2011-01-12 | Payer: Self-pay | Attending: Emergency Medicine | Admitting: Emergency Medicine

## 2011-01-12 ENCOUNTER — Emergency Department (HOSPITAL_COMMUNITY): Payer: Self-pay

## 2011-01-12 DIAGNOSIS — IMO0002 Reserved for concepts with insufficient information to code with codable children: Secondary | ICD-10-CM | POA: Insufficient documentation

## 2011-01-12 DIAGNOSIS — S0180XA Unspecified open wound of other part of head, initial encounter: Secondary | ICD-10-CM | POA: Insufficient documentation

## 2011-01-12 DIAGNOSIS — R51 Headache: Secondary | ICD-10-CM | POA: Insufficient documentation

## 2014-01-26 ENCOUNTER — Encounter (HOSPITAL_COMMUNITY): Payer: Self-pay | Admitting: Emergency Medicine

## 2014-01-26 ENCOUNTER — Emergency Department (HOSPITAL_COMMUNITY)
Admission: EM | Admit: 2014-01-26 | Discharge: 2014-01-27 | Disposition: A | Discharge status: Home / Self Care | Payer: Self-pay | Attending: Emergency Medicine | Admitting: Emergency Medicine

## 2014-01-26 ENCOUNTER — Emergency Department (HOSPITAL_COMMUNITY): Payer: Self-pay

## 2014-01-26 DIAGNOSIS — Y9389 Activity, other specified: Secondary | ICD-10-CM | POA: Insufficient documentation

## 2014-01-26 DIAGNOSIS — Z79899 Other long term (current) drug therapy: Secondary | ICD-10-CM | POA: Insufficient documentation

## 2014-01-26 DIAGNOSIS — F17200 Nicotine dependence, unspecified, uncomplicated: Secondary | ICD-10-CM | POA: Insufficient documentation

## 2014-01-26 DIAGNOSIS — IMO0002 Reserved for concepts with insufficient information to code with codable children: Secondary | ICD-10-CM | POA: Insufficient documentation

## 2014-01-26 DIAGNOSIS — M5442 Lumbago with sciatica, left side: Secondary | ICD-10-CM

## 2014-01-26 DIAGNOSIS — K279 Peptic ulcer, site unspecified, unspecified as acute or chronic, without hemorrhage or perforation: Secondary | ICD-10-CM | POA: Insufficient documentation

## 2014-01-26 DIAGNOSIS — Y9289 Other specified places as the place of occurrence of the external cause: Secondary | ICD-10-CM | POA: Insufficient documentation

## 2014-01-26 DIAGNOSIS — K219 Gastro-esophageal reflux disease without esophagitis: Secondary | ICD-10-CM | POA: Insufficient documentation

## 2014-01-26 DIAGNOSIS — M62830 Muscle spasm of back: Secondary | ICD-10-CM

## 2014-01-26 HISTORY — DX: Peptic ulcer, site unspecified, unspecified as acute or chronic, without hemorrhage or perforation: K27.9

## 2014-01-26 MED ORDER — OXYCODONE-ACETAMINOPHEN 5-325 MG PO TABS
1.0000 | ORAL_TABLET | Freq: Once | ORAL | Status: AC
  Administered 2014-01-26: 1 via ORAL
  Filled 2014-01-26: qty 1

## 2014-01-26 MED ORDER — KETOROLAC TROMETHAMINE 30 MG/ML IJ SOLN
30.0000 mg | Freq: Once | INTRAMUSCULAR | Status: AC
  Administered 2014-01-26: 30 mg via INTRAMUSCULAR
  Filled 2014-01-26: qty 1

## 2014-01-26 NOTE — ED Notes (Signed)
Informed pt urine is needed. Pt does not need to urinate at this time.
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
## 2014-01-26 NOTE — ED Notes (Signed)
Pt arrives via EMS with sudden onset back pain when lifting nieces, states he injured his back years ago and continues to have pain. Was unable to get up after, no loss of control of bowel and bladder. Mild tingling in R arm and R foot.

## 2014-01-26 NOTE — ED Notes (Signed)
Per [facility] pt from home, EMS reports pt bent over at waist to pick up his grandchildren, when he had lower back pain more to Left lateral back pain, had a to lay on the floor for a while before he was able to get himself up. Pt's cousin picked him up and placed him on the cough. Pt reports a hx of back pain 2 years ago from lifting but not to this extent of pain. VSS BP 132/90, HR 92, RR 24

## 2014-01-27 LAB — URINALYSIS, ROUTINE W REFLEX MICROSCOPIC
Bilirubin Urine: NEGATIVE
Glucose, UA: NEGATIVE mg/dL
HGB URINE DIPSTICK: NEGATIVE
Ketones, ur: 15 mg/dL — AB
LEUKOCYTES UA: NEGATIVE
Nitrite: NEGATIVE
PROTEIN: NEGATIVE mg/dL
Specific Gravity, Urine: 1.025 (ref 1.005–1.030)
UROBILINOGEN UA: 1 mg/dL (ref 0.0–1.0)
pH: 5.5 (ref 5.0–8.0)

## 2014-01-27 MED ORDER — OXYCODONE-ACETAMINOPHEN 5-325 MG PO TABS: 1.0000 | ORAL_TABLET | Freq: Four times a day (QID) | ORAL | Status: DC | PRN

## 2014-01-27 MED ORDER — OXYCODONE-ACETAMINOPHEN 5-325 MG PO TABS
1.0000 | ORAL_TABLET | Freq: Once | ORAL | Status: AC
  Administered 2014-01-27: 1 via ORAL
  Filled 2014-01-27: qty 1

## 2014-01-27 MED ORDER — CYCLOBENZAPRINE HCL 10 MG PO TABS: 10.0000 mg | ORAL_TABLET | Freq: Three times a day (TID) | ORAL | Status: DC | PRN

## 2014-01-27 MED ORDER — NAPROXEN 500 MG PO TABS: 500.0000 mg | ORAL_TABLET | Freq: Two times a day (BID) | ORAL | Status: DC | PRN

## 2014-01-27 NOTE — ED Provider Notes (Signed)
Medical screening examination/treatment/procedure(s) were performed by non-physician practitioner and as supervising physician I was immediately available for consultation/collaboration.   EKG Interpretation None        Tomasita Crumble, MD 01/27/14 [phone]

## 2014-01-27 NOTE — Discharge Instructions (Signed)
Back Pain: Your back pain should be treated with medicines such as ibuprofen or naprosyn and this back pain should get better over the next 2 weeks.  However if you develop severe or worsening pain, low back pain with fever, numbness, weakness or inability to walk or urinate, you should return to the ER immediately.  Please follow up with your doctor this week for a recheck if still having symptoms.  Low back pain is discomfort in the lower back that may be due to injuries to muscles and ligaments around the spine.  Occasionally, it may be caused by a a problem to a part of the spine called a disc.  The pain may last several days or a week;  However, most patients get completely well in 4 weeks.  Self - care:  The application of heat can help soothe the pain.  Maintaining your daily activities, including walking, is encourged, as it will help you get better faster than just staying in bed. Perform gentle stretching as discussed. Drink plenty of fluids.  Medications are also useful to help with pain control.  A commonly prescribed medications includes percocet, but don't drive or operate machinery while taking this medication.  Non steroidal anti inflammatory medications including Ibuprofen and naproxen;  These medications help both pain and swelling and are very useful in treating back pain.  They should be taken with food, as they can cause stomach upset, and more seriously, stomach bleeding.    Muscle relaxants (flexeril):  These medications can help with muscle tightness that is a cause of lower back pain.  Most of these medications can cause drowsiness, and it is not safe to drive or use dangerous machinery while taking them.  SEEK IMMEDIATE MEDICAL ATTENTION IF: New numbness, tingling, weakness, or problem with the use of your arms or legs.  Severe back pain not relieved with medications.  Difficulty with or loss of control of your bowel or bladder control.  Increasing pain in any areas of the  body (such as chest or abdominal pain).  Shortness of breath, dizziness or fainting.  Nausea (feeling sick to your stomach), vomiting, fever, or sweats.  You will need to follow up with  Your primary healthcare provider in 1-2 weeks for reassessment.  If you do not have a doctor see the list below.   Back Pain, Adult Back pain is very common. The pain often gets better over time. The cause of back pain is usually not dangerous. Most people can learn to manage their back pain on their own.  HOME CARE   Stay active. Start with short walks on flat ground if you can. Try to walk farther each day.  Do not sit, drive, or stand in one place for more than 30 minutes. Do not stay in bed.  Do not avoid exercise or work. Activity can help your back heal faster.  Be careful when you bend or lift an object. Bend at your knees, keep the object close to you, and do not twist.  Sleep on a firm mattress. Lie on your side, and bend your knees. If you lie on your back, put a pillow under your knees.  Only take medicines as told by your doctor.  Put ice on the injured area.  Put ice in a plastic bag.  Place a towel between your skin and the bag.  Leave the ice on for 15-20 minutes, 03-04 times a day for the first 2 to 3 days. After that, you can  switch between ice and heat packs.  Ask your doctor about back exercises or massage.  Avoid feeling anxious or stressed. Find good ways to deal with stress, such as exercise. GET HELP RIGHT AWAY IF:   Your pain does not go away with rest or medicine.  Your pain does not go away in 1 week.  You have new problems.  You do not feel well.  The pain spreads into your legs.  You cannot control when you poop (bowel movement) or pee (urinate).  Your arms or legs feel weak or lose feeling (numbness).  You feel sick to your stomach (nauseous) or throw up (vomit).  You have belly (abdominal) pain.  You feel like you may pass out (faint). MAKE SURE  YOU:   Understand these instructions.  Will watch your condition.  Will get help right away if you are not doing well or get worse. Document Released: 11/03/2007 Document Revised: 08/09/2011 Document Reviewed: 09/18/2013 [facility] Patient Information 2015 [place], [place]. This information is not intended to replace advice given to you by your health care provider. Make sure you discuss any questions you have with your health care provider.  Heat Therapy Heat therapy can help ease sore, stiff, injured, and tight muscles and joints. Heat relaxes your muscles, which may help ease your pain.  RISKS AND COMPLICATIONS If you have any of the following conditions, do not use heat therapy unless your health care provider has approved:  Poor circulation.  Healing wounds or scarred skin in the area being treated.  Diabetes, heart disease, or high blood pressure.  Not being able to feel (numbness) the area being treated.  Unusual swelling of the area being treated.  Active infections.  Blood clots.  Cancer.  Inability to communicate pain. This may include young children and people who have problems with their brain function (dementia).  Pregnancy. Heat therapy should only be used on old, pre-existing, or long-lasting (chronic) injuries. Do not use heat therapy on new injuries unless directed by your health care provider. HOW TO USE HEAT THERAPY There are several different kinds of heat therapy, including:  Moist heat pack.  Warm water bath.  Hot water bottle.  Electric heating pad.  Heated gel pack.  Heated wrap.  Electric heating pad. Use the heat therapy method suggested by your health care provider. Follow your health care provider's instructions on when and how to use heat therapy. GENERAL HEAT THERAPY RECOMMENDATIONS  Do not sleep while using heat therapy. Only use heat therapy while you are awake.  Your skin may turn pink while using heat therapy. Do not use heat  therapy if your skin turns red.  Do not use heat therapy if you have new pain.  High heat or long exposure to heat can cause burns. Be careful when using heat therapy to avoid burning your skin.  Do not use heat therapy on areas of your skin that are already irritated, such as with a rash or sunburn. SEEK MEDICAL CARE IF:  You have blisters, redness, swelling, or numbness.  You have new pain.  Your pain is worse. MAKE SURE YOU:  Understand these instructions.  Will watch your condition.  Will get help right away if you are not doing well or get worse. Document Released: 08/09/2011 Document Revised: 10/01/2013 Document Reviewed: 07/10/2013 [facility] Patient Information 2015 [place], [place]. This information is not intended to replace advice given to you by your health care provider. Make sure you discuss any questions you have with your  health care provider.  Muscle Cramps and Spasms Muscle cramps and spasms are when muscles tighten by themselves. They usually get better within minutes. Muscle cramps are painful. They are usually stronger and last longer than muscle spasms. Muscle spasms may or may not be painful. They can last a few seconds or much longer. HOME CARE  Drink enough fluid to keep your pee (urine) clear or pale yellow.  Massage, stretch, and relax the muscle.  Use a warm towel, heating pad, or warm shower water on tight muscles.  Place ice on the muscle if it is tender or in pain.  Put ice in a plastic bag.  Place a towel between your skin and the bag.  Leave the ice on for 15-20 minutes, 03-04 times a day.  Only take medicine as told by your doctor. GET HELP RIGHT AWAY IF:  Your cramps or spasms get worse, happen more often, or do not get better with time. MAKE SURE YOU:  Understand these instructions.  Will watch your condition.  Will get help right away if you are not doing well or get worse. Document Released: 04/29/2008 Document Revised: 09/11/2012  Document Reviewed: 05/03/2012 [facility] Patient Information 2015 [place], [place]. This information is not intended to replace advice given to you by your health care provider. Make sure you discuss any questions you have with your health care provider.  Emergency Department Resource Guide 1) Find a Doctor and Pay Out of Pocket Although you won't have to find out who is covered by your insurance plan, it is a good idea to ask around and get recommendations. You will then need to call the office and see if the doctor you have chosen will accept you as a new patient and what types of options they offer for patients who are self-pay. Some doctors offer discounts or will set up payment plans for their patients who do not have insurance, but you will need to ask so you aren't surprised when you get to your appointment.  2) Contact Your Local Health Department Not all health departments have doctors that can see patients for sick visits, but many do, so it is worth a call to see if yours does. If you don't know where your local health department is, you can check in your phone book. The CDC also has a tool to help you locate your state's health department, and many state websites also have listings of all of their local health departments.  3) Find a Walk-in Clinic If your illness is not likely to be very severe or complicated, you may want to try a walk in clinic. These are popping up all over the country in pharmacies, drugstores, and shopping centers. They're usually staffed by nurse practitioners or physician assistants that have been trained to treat common illnesses and complaints. They're usually fairly quick and inexpensive. However, if you have serious medical issues or chronic medical problems, these are probably not your best option.  No Primary Care Doctor: - Call Health Connect at  [phone] - they can help you locate a primary care doctor that  accepts your insurance, provides certain services,  etc. - Physician Referral Service- [phone]  Chronic Pain Problems: Organization         Address  Phone   Notes  [facility]  [phone] Patients need to be referred by their primary care doctor.   Medication Assistance: Retail buyer  Notes  [facility] Medication [facility] [address] [place], [place] [address] [phone] --Must be a resident of [facility] -- Must have NO insurance coverage whatsoever (no Medicaid/ Medicare, etc.) -- The pt. MUST have a primary care doctor that directs their care regularly and follows them in the community   MedAssist  [phone]) [phone]   [employer]  [phone]    Agencies that provide inexpensive medical care: Organization         Address  Phone   Notes  [facility]  [phone]   [facility]    [phone]   [facility] [address] [place], [place] [address] [phone]   [facility] [address], [place] [phone]   [facility]anned Parenthood    [phone]) [phone]   [facility]    [phone]) [phone]   [facility]  [address], [place] Phone:  [phone], Fax:  [phone] Hours of Operation:  9 am - 6 pm, M-F.  Also accepts Medicaid/Medicare and self-pay.  [facility]  [address], [place] Phone: [phone]) [phone], Fax: [phone]. Hours of Operation:  8:30 am - 5:30 pm, M-F.  Also accepts Medicaid and self-pay.  [facility] [place] [address], [place] Phone: [phone](336) [phone]   [facility] [address], [place], [place] [phone], Ext. [phone] Mondays & Thursdays: 7-9 AM.  First 15 patients are seen on a first come, first serve basis.    Medicaid-accepting [facility] Providers:  Organization         Address  Phone   Notes  [facility] [address], [place] [phone] Also accepts self-pay patients.  [facility] [address], [place]  [phone]) [phone]   [facility] [address], [place] [phone]) [phone]   [facility] [address] [place] [address], [place] [phone]) [phone]   Renaye RakersVeita Bland [address], [place]   [phone] Only accepts [place] Access [place] patients after they have their name applied to their card.   Self-Pay (no insurance) in [facility] [place]:  Organization         Address  Phone   Notes  Sickle Cell Patients, [facility] [address], [place] [phone]   [facility] [address], [place] [phone]) [phone]   [facility] [place]  [address], [place] [phone]) [phone]   [facility]/Dr. Osei-Bonsu  [address] [place] [address], [place] or [phone], [place] [phone]) [phone] Phone number for both [place] and [place] locations is the same.  [facility] [address], [place] [phone]   [facility] [place] [address] [place] [address], [place] or [address] [phone]) [phone]   [facility] [address], [place] [phone], phone; [phone]) [phone], fax Sees patients 1st and 3rd Saturday of every month.  Must not qualify for public or private insurance (i.e. Medicaid, Medicare, [place] Health Choice, Veterans' Benefits)  Household income should be no more than 200% of the poverty level The clinic cannot treat you if you are pregnant or think you are pregnant  Sexually transmitted diseases  are not treated at the clinic.    Dental Care: Organization         Address  Phone  Notes  [facility] Department of [facility] [address] [place], [place] [phone] Accepts children up to  age 16 who are enrolled in [place] or [place] Health Choice; pregnant women with a Medicaid card; and children who have applied for Medicaid or [place] Health Choice, but were declined, whose parents can pay a reduced fee at time of service.  [facility] Department of [facility]  [address], [place] [phone] Accepts children up to age 36 who are enrolled in [place] or [place] Health Choice; pregnant women with a Medicaid card; and children who have applied for Medicaid or [place] Health Choice, but were declined, whose parents can pay a reduced fee at time of service.  [facility]  [address] [place], [place] [phone] Patients are seen by appointment only. Walk-ins are not accepted. [facility] will see patients 30 years of age and older. Monday - Tuesday (8am-5pm) Most Wednesdays (8:30-5pm) $30 per visit, cash only  [facility] Adult [facility] Access PROGRAM  [address], [facility] [phone] Patients are seen by appointment only. Walk-ins are not accepted. [facility] will see patients 35 years of age and older. One Wednesday Evening (Monthly: Volunteer Based).  $30 per visit, cash only  [employer]  [phone] for adults; Children under age 98, call Graduate Pediatric Dentistry at [phone]. Children aged 86-14, please call [phone] to request a pediatric application.  Dental services are provided in all areas of dental care including fillings, crowns and bridges, complete and partial dentures, implants, gum treatment, root canals, and extractions. Preventive care is also provided. Treatment is provided to both adults and children. Patients are selected via a lottery and there is often a waiting list.   [facility] [address] [place]  [phone] [email or web address]   [facility] [address] [place], [place] [phone], Ext. [phone] Second and Fourth Thursday of  each month, opens at 6:30 AM; Clinic ends at 9 AM.  Patients are seen on a first-come first-served basis, and a limited number are seen during each clinic.   [facility]  [address] [place], [place] [phone]   Eligibility Requirements You must have lived in [place], [place], or [place] counties for at least the last three months.   You cannot be eligible for state or federal sponsored [place], including [employer], [place], or [employer].   You generally cannot be eligible for healthcare insurance through your employer.    How to apply: Eligibility screenings are held every Tuesday and Wednesday afternoon from 1:00 pm until 4:00 pm. You do not need an appointment for the interview!  [facility] [address] [place], [place] [phone]   [facility] Health Department  [phone]   [facility] Health Department  [phone]   [facility] Health Department  [phone]    Behavioral Health Resources in the Community: Intensive Outpatient Programs Organization         Address  Phone  Notes  [facility] Services [address], [place], [place] [phone]   [facility]patient [address] [place], [place] [phone]   [facility] [address] [place], [place]  [phone]  [facility] [address]  [place], [place] [phone] or [phone]   Substance Abuse Resources Organization         Address  Phone  Notes  Alcohol [facility] Drug Services  ([phone]   [facility]  [phone]) [phone]   [facility]  (867)[phone]   Floydene Flock  [phone]   Residential & Outpatient Substance Abuse Program  [phone]   Psychological Services Organization         Address  Phone  Notes  [facility] Health  [phone]   [facility] Services  [phone]   [facility] Mental Health [address]  [place] [phone] or [phone]    Mobile Crisis Teams Organization         Address  Phone  Notes  [facility], Mobile Crisis Care Unit  [phone]   [facility]  [address] [place], [place] [phone]   Doristine Locks [address] [place] [phone]    Self-Help/Support Groups Organization         Address  Phone             Notes  [facility] [place] - variety of support groups  [phone]- I7437963 Call for more information  Narcotics Anonymous (NA), [facility] [address], [employer] [place]  2 meetings at this location   Statistician         Address  Phone  Notes  [facility] [address],    [place]  [phone]   [facility]  [address], [place] [address], [place], [place] [phone]   [facility] Treatment Facility [address] [place], [place] [phone] Admissions: 8am-3pm M-F  [facility] [address] [place] [address],    [place], [place] [phone]   [facility] [address] [place], [place], [place] [phone]   [facility] [address],  [place], [place] [phone]   [facility] - Intensive Outpatient [address], [place], [place] [phone]   [facility] ([facility]iction [facility] [address]  [place], [place] [phone] or [phone]   [facility] ([facility]) [address] [place], [place] [phone] Accepts Medicaid  [facility] [place] [address],  [place] [phone] Substance Abuse/Addiction Treatment   [facility] Organization         Address  Phone  Notes  [facility]  [phone]   Angie Fava, PhD [address] [place], [place]   [phone] or [phone]) [phone]   [facility]   [address] [place], [place] [phone]     [facility] [address] [place], [place] [phone] Insurance/Medicaid/sponsorship through [facility] and Families [address]                                    [place], [place] [phone] Therapy/tele-psych/case  [facility] [address], [place] [phone]    Dr. Lolly Mustache  [phone]   [facility]  [employer] [facility] Dept. 1) [address] [place] 2) [address] [place] 3)  [address], [place] [phone]  [phone]   [facility] Child Abuse Hotline (762) 258-7245 or 8433290335 (After  Hours)

## 2016-07-08 ENCOUNTER — Emergency Department (HOSPITAL_COMMUNITY): Payer: Self-pay

## 2016-07-08 ENCOUNTER — Emergency Department (HOSPITAL_COMMUNITY)
Admission: EM | Admit: 2016-07-08 | Discharge: 2016-07-08 | Disposition: A | Discharge status: Home / Self Care | Payer: Self-pay | Attending: Emergency Medicine | Admitting: Emergency Medicine

## 2016-07-08 ENCOUNTER — Encounter (HOSPITAL_COMMUNITY): Payer: Self-pay | Admitting: Emergency Medicine

## 2016-07-08 DIAGNOSIS — R29898 Other symptoms and signs involving the musculoskeletal system: Secondary | ICD-10-CM

## 2016-07-08 DIAGNOSIS — M21331 Wrist drop, right wrist: Secondary | ICD-10-CM | POA: Insufficient documentation

## 2016-07-08 DIAGNOSIS — M6281 Muscle weakness (generalized): Secondary | ICD-10-CM | POA: Insufficient documentation

## 2016-07-08 DIAGNOSIS — R200 Anesthesia of skin: Secondary | ICD-10-CM | POA: Insufficient documentation

## 2016-07-08 DIAGNOSIS — Z5181 Encounter for therapeutic drug level monitoring: Secondary | ICD-10-CM | POA: Insufficient documentation

## 2016-07-08 DIAGNOSIS — F17210 Nicotine dependence, cigarettes, uncomplicated: Secondary | ICD-10-CM | POA: Insufficient documentation

## 2016-07-08 LAB — COMPREHENSIVE METABOLIC PANEL
ALT: 15 U/L — ABNORMAL LOW (ref 17–63)
AST: 31 U/L (ref 15–41)
Albumin: 4.1 g/dL (ref 3.5–5.0)
Alkaline Phosphatase: 59 U/L (ref 38–126)
Anion gap: 11 (ref 5–15)
BUN: 12 mg/dL (ref 6–20)
CO2: 21 mmol/L — ABNORMAL LOW (ref 22–32)
Calcium: 9.2 mg/dL (ref 8.9–10.3)
Chloride: 104 mmol/L (ref 101–111)
Creatinine, Ser: 0.94 mg/dL (ref 0.61–1.24)
GFR calc Af Amer: >60 mL/min (ref >60)
GFR calc non Af Amer: >60 mL/min (ref >60)
Glucose, Bld: 103 mg/dL — ABNORMAL HIGH (ref 65–99)
Potassium: 4.4 mmol/L (ref 3.5–5.1)
Sodium: 136 mmol/L (ref 135–145)
Total Bilirubin: 0.9 mg/dL (ref 0.3–1.2)
Total Protein: 7.4 g/dL (ref 6.5–8.1)

## 2016-07-08 LAB — I-STAT CHEM 8, ED
BUN: 17 mg/dL (ref 6–20)
Calcium, Ion: 0.87 mmol/L — CL (ref 1.15–1.40)
Chloride: 108 mmol/L (ref 101–111)
Creatinine, Ser: 0.8 mg/dL (ref 0.61–1.24)
GLUCOSE: 100 mg/dL — AB (ref 65–99)
HEMATOCRIT: 47 % (ref 39.0–52.0)
HEMOGLOBIN: 16 g/dL (ref 13.0–17.0)
POTASSIUM: 5.1 mmol/L (ref 3.5–5.1)
SODIUM: 135 mmol/L (ref 135–145)
TCO2: 25 mmol/L (ref 0–100)

## 2016-07-08 LAB — DIFFERENTIAL
BASOS PCT: 1 %
Basophils Absolute: 0 10*3/uL (ref 0.0–0.1)
EOS ABS: 0.1 10*3/uL (ref 0.0–0.7)
Eosinophils Relative: 2 %
Lymphocytes Relative: 37 %
Lymphs Abs: 2.4 10*3/uL (ref 0.7–4.0)
MONO ABS: 0.5 10*3/uL (ref 0.1–1.0)
MONOS PCT: 7 %
Neutro Abs: 3.4 10*3/uL (ref 1.7–7.7)
Neutrophils Relative %: 53 %

## 2016-07-08 LAB — CBC
HCT: 43.6 % (ref 39.0–52.0)
Hemoglobin: 15 g/dL (ref 13.0–17.0)
MCH: 32.4 pg (ref 26.0–34.0)
MCHC: 34.4 g/dL (ref 30.0–36.0)
MCV: 94.2 fL (ref 78.0–100.0)
Platelets: 325 10*3/uL (ref 150–400)
RBC: 4.63 MIL/uL (ref 4.22–5.81)
RDW: 14.3 % (ref 11.5–15.5)
WBC: 6.4 10*3/uL (ref 4.0–10.5)

## 2016-07-08 LAB — I-STAT TROPONIN, ED: Troponin i, poc: 0.04 ng/mL (ref 0.00–0.08)

## 2016-07-08 LAB — APTT: APTT: 37 s — AB (ref 24–36)

## 2016-07-08 LAB — PROTIME-INR
INR: 0.93
Prothrombin Time: 12.4 seconds (ref 11.4–15.2)

## 2016-07-08 NOTE — ED Notes (Signed)
Patient transported to MRI 

## 2016-07-08 NOTE — ED Notes (Signed)
Pt to CT before coming to room.

## 2016-07-08 NOTE — ED Notes (Signed)
Pt states it hurts to lift his right arm, but he is able to.

## 2016-07-08 NOTE — ED Triage Notes (Signed)
Pt sts right arm pain and numbness into leg upon waking this am; grip strength weak on right side; pt sts was at work and unable to perform normal duties
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
## 2021-12-18 ENCOUNTER — Emergency Department
Admission: EM | Admit: 2021-12-18 | Discharge: 2021-12-18 | Disposition: A | Discharge status: Home / Self Care | Payer: Self-pay | Attending: Emergency Medicine | Admitting: Emergency Medicine

## 2021-12-18 DIAGNOSIS — W260XXA Contact with knife, initial encounter: Secondary | ICD-10-CM | POA: Insufficient documentation

## 2021-12-18 DIAGNOSIS — Y990 Civilian activity done for income or pay: Secondary | ICD-10-CM | POA: Insufficient documentation

## 2021-12-18 DIAGNOSIS — Y93G1 Activity, food preparation and clean up: Secondary | ICD-10-CM | POA: Insufficient documentation

## 2021-12-18 DIAGNOSIS — S68119A Complete traumatic metacarpophalangeal amputation of unspecified finger, initial encounter: Secondary | ICD-10-CM

## 2021-12-18 DIAGNOSIS — S61012A Laceration without foreign body of left thumb without damage to nail, initial encounter: Secondary | ICD-10-CM | POA: Insufficient documentation

## 2021-12-18 NOTE — ED Provider Notes (Signed)
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 Tdap UTD.    I spoke to and examined the patient as well: Yes  I was present during key portions of any procedures performed: Yes            VISIT INFORMATION        Clinical Course in the ED:                   Medications Given in the ED:    .     ED Medication Orders (From admission, onward)      None              Procedures:            Interpretations:      O2 sat-           saturation: 96 %; Oxygen use: room air; Interpretation: Normal                 PAST HISTORY        Primary Care Provider: Pcp, None, MD        PMH/PSH:    .     No past medical history on file.    He has no past surgical history on file.      Social/Family History:      He has no history on file for tobacco use, alcohol use, and drug use.    No family history on file.      Listed Medications on Arrival:    .     Home Medications    None on File        Allergies: He is allergic to hydrocodone.            RESULTS        Lab Results:      Results       ** No results found for the last 24 hours. **                Radiology Results:      No orders to display               Attending Attestation:      The patient was seen and examined by the mid-level (physician assistant or nurse practitioner), or fellow, and the plan of care was discussed with me. I agree with the plan as it was presented to me.  I have reviewed and agree with the final ED diagnosis.              Scribe Attestation:      No scribe involved in the care of this patient            Eula Flax, MD  12/18/21 1455

## 2021-12-18 NOTE — ED Provider Notes (Signed)
Adrian Sullivan HISTORY AND PHYSICAL EXAM     Patient Name: Adrian Sullivan, Adrian Sullivan  Department:[facility] EMERGENCY DEPT  Encounter Date:  12/18/2021  Attending Physician: Eula Flax, MD   Age: 61 y.o. male  Patient Room: Ansel[facility] Rod [id number]  PCP: Nathanial Rancher, MD        Diagnosis/Disposition:     Final diagnoses:   Amputation of tip of finger, initial encounter       ED Disposition       ED Disposition   Discharge    Condition   --    Date/Time   Fri Dec 18, 2021  2:49 PM    Comment   Deondrae Sullivan discharge to home/self care.    Condition at disposition: Stable                 Follow-Up Providers (if applicable)    [facility]  [address]  [phone]  In 3 days      [facility] Emergency Dept  [address]  [place]  [phone]    If symptoms worsen       New Prescriptions    No medications on file             Medical Decision Making:     Initial Differential Diagnosis:  Initial differential diagnosis to include but not limited to: Laceration, abrasion, avulsion    Plan:  Patient presenting to the emergency department for a L thumb distal fingertip amputation on a chef's knife while at work prior to arrival.  Significant amount of bleeding on presentation.  Hemostasis achieved with Surgifoam, quick clot and finger tourniquet as detailed in procedure note.  Patient observed in the emergency department for 20 minutes without rebleeding.  Tetanus up-to-date.  Stable for discharge with outpatient follow-up for wound checks.    Final Impression:  Distal fingertip amputation  The patient was deemed stable for discharge. They were given strict return precautions as it relates to their presumed diagnosis, verbalized understanding of these precautions and agreed to follow up as instructed. All questions were answered prior to discharge.         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  bleeding controlled w bandage. states last tetanus <62yr ago.  Chief complaint: Laceration    Adrian L JMartiniqueis a 61y.o. male with no significant past medical history presents to the emergency department for laceration to the L distal fingertips sustained at work.  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  Reports sudden onset of pain and bleeding.  His last tetanus shot was less than 5 years ago          Review of Systems:  Physical Exam:     Review of Systems    Positive and negative ROS per above and in HPI. All other systems reviewed and negative.     Pulse 83  BP 124/82  Resp 16  SpO2 96 %  Temp 98.2 F (36.8 C)     Physical Exam  Vitals and nursing note reviewed.  Constitutional:       General: He is not in acute distress.     Appearance: He is not toxic-appearing.   HENT:      Head: Normocephalic and atraumatic.   Cardiovascular:      Rate and Rhythm: Normal rate and regular rhythm.   Pulmonary:      Effort: Pulmonary effort is normal.      Breath sounds: Normal breath sounds.   Abdominal:      Palpations: Abdomen is soft.      Tenderness: There is no abdominal tenderness.   Musculoskeletal:         General: Normal range of motion.      Comments: L first digit with distal fingertip amputation.  Brisk bleeding.  No exposed bone.   Skin:     General: Skin is warm and dry.   Neurological:      Mental Status: He is alert and oriented to person, place, and time.      Gait: Gait normal.   Psychiatric:         Mood and Affect: Mood normal.             Interpretations, Clinical Decision Tools and Critical Care:                Procedures:   Lac Repair    Date/Time: 12/18/2021 3:16 PM    Performed by: Clifton Custard, [place]  Authorized by: Eula Flax, MD    Consent:     Consent obtained:  Verbal    Consent given by:  Patient    Risks,  benefits, and alternatives were discussed: yes    Universal protocol:     Procedure explained and questions answered to patient or proxy's satisfaction: yes    Anesthesia:     Anesthesia method:  Nerve block    Block location:  L thumb digital block    Block needle gauge:  30 G    Block anesthetic:  Lidocaine 1% w/o epi    Block injection procedure:  Anatomic landmarks identified, introduced needle, anatomic landmarks palpated and negative aspiration for blood    Block outcome:  Anesthesia achieved  Laceration details:     Location:  Finger    Finger location:  L thumb    Wound length (cm): distal finger tip amputation.  Exploration:     Hemostasis obtained with: surgifoam, quickclot, finger tornequet.  Treatment:     Area cleansed with:  Povidone-iodine    Amount of cleaning:  Extensive  Approximation:     Laceration repair approximation: No skin flap present to close wound.  Direct pressure with quick clot, Surgifoam and finger tourniquet used.  Patient observed for 20 minutes finger tourniquet applied.  After removal patient observed with no rebleeding.        Attestations:     Documentation Notes:  Parts of this note were generated by the Epic EMR system/ Dragon speech recognition and may contain inherent errors or omissions not intended by the user. Grammatical errors, random word insertions, deletions, pronoun errors and incomplete sentences are occasional consequences of this technology due to software limitations. Not all errors are caught or corrected.  My documentation is often completed after the patient is no longer under my clinical care. In some cases, the Epic EMR may pull updated results into the above documentation which may not reflect all results or information that were available to me at the time of my medical decision making.   If  there are questions or concerns about the content of this note or information contained within the body of this dictation they should be addressed directly with the  author for clarification.                  Lynnae January Morrill, [place]  12/18/21 1523

## 2021-12-18 NOTE — Discharge Instructions (Signed)
Dear Mr. Adrian Sullivan:    Thank you for choosing the [facility] Emergency Department, the premier emergency department in the [place] area.  I hope your visit today was EXCELLENT. You will receive a survey via text message that will give you the opportunity to provide feedback to your team about your visit. Please do not hesitate to reach out with any questions!    Specific instructions for your visit today:    Amputated Finger Tip       You have been seen for an amputated or nearly amputated fingertip.  A special dressing was applied to your fingertip to help with the bleeding today.  Please leave this dressing on for at least 24 hours to continue to prevent bleeding.  After that you can take the dressing down daily to check for signs of infection.  Return to the emergency department immediately if you see signs of infection.  Otherwise follow-up with your primary care doctor in a few days to ensure that it is healing well.    Amputation means the tip was fully separated from the finger. The skin, nail and soft tissue at the tip of the finger is sometimes cut away. When this happens, it is harder to fix the damage. Fingertip repair or reattachment depends on how far down the injury is. The fingertip may be partially amputated but still have a good blood supply. In this case, the tip may be able to be put back into place. In some cases, it can be impossible to reattach the fingertip. Here, the goal is to control bleeding and pain and repair as much damage as possible.     The tip will heal in a few weeks.  This will protect the finger from being injured again. Keep the finger clean and dry. This will prevent infection.  When the tip heals fully, you should be able to use the finger like normal. You may be sensitive to touch and temperature. This may not go away.     WOUND CARE: Take off the splint or bandage at least once a day. Wash the finger gently in soap and water. Let the finger to air dry. Look for signs of  infection. These can be a lot of redness, pus, pain, swelling or fever (temperature higher than 100.58F / 38C). Put on a thin layer of antibiotic ointment. This can be Neosporin, Polysporin or Bacitracin. Then put on a Band-Aid. You can also tape gauze over the fingertip. The best kind of dressing does not stick to the healing part of the wound. There are a few good dressings available at the store. One is called "Adaptic." Another is called "Telfa." They are good because they do not stick to wounds.  Keep the wound dry. DO NOT go swimming or take a bath. However, you can do so if your wound is protected from getting wet.  Unless your doctor tells you to, DO NOT soak the wound in any solutions. This includes Epsom salts, peroxide, iodine (Betadine) or saline.     YOU SHOULD SEEK MEDICAL ATTENTION IMMEDIATELY, EITHER HERE OR AT THE NEAREST EMERGENCY DEPARTMENT, IF ANY OF THE FOLLOWING OCCURS:  More pain in the finger or hand.  Bleeding through the dressing.  The finger gets swollen or redder.  Discharge or pus draining from the finger.  Fever (temperature higher than 100.58F / 38C), chills or feeling unwell.                 IF YOU  DO NOT CONTINUE TO IMPROVE OR YOUR CONDITION WORSENS, PLEASE CONTACT YOUR DOCTOR OR RETURN IMMEDIATELY TO THE EMERGENCY DEPARTMENT.    Sincerely,  Dion Body, Merri Ray, MD  Attending Emergency Physician  [facility] Emergency Department    ONSITE PHARMACY  Our full service onsite pharmacy is located in the ER waiting room.  Open 7 days a week from 9 am to 9 pm.  We accept all major insurances and prices are competitive with major retailers.  Ask your provider to print your prescriptions down to the pharmacy to speed you on your way home.    OBTAINING A PRIMARY CARE APPOINTMENT    Primary care physicians (PCPs, also known as primary care doctors) are either internists or family medicine doctors. Both types of PCPs focus on health promotion, disease prevention, patient education  and counseling, and treatment of acute and chronic medical conditions.    If you need a primary care doctor, please call the below number and ask who is receiving new patients.     [facility]  Telephone:  [phone]  [email or web address]    DOCTOR REFERRALS  Call [phone] (available 24 hours a day, 7 days a week) if you need any further referrals and we can help you find a primary care doctor or specialist.  Also, available online at:  [email or web address]    YOUR CONTACT INFORMATION  Before leaving please check with registration to make sure we have an up-to-date contact number.  You can call registration at [phone] to update your information.  For questions about your hospital bill, please call [phone].  For questions about your Emergency Dept Physician bill please call [phone].      [place]  If you need help with health or social services, please call 2-1-1 for a free referral to resources in your area.  2-1-1 is a free service connecting people with information on health insurance, free clinics, pregnancy, mental health, dental care, food assistance, housing, and substance abuse counseling.  Also, available online at:  http://www.[place].org    MEDICAL RECORDS AND TESTS  Certain laboratory test results do not come back the same day, for example urine cultures.   We will contact you if other important findings are noted.  Radiology films are often reviewed again to ensure accuracy.  If there is any discrepancy, we will notify you.      Please call [phone] to pick up a complimentary CD of any radiology studies performed.  If you or your doctor would like to request a copy of your medical records, please call [phone].      ORTHOPEDIC INJURY   Please know that significant injuries can exist even when an initial x-ray is read as normal or negative.  This can occur because some fractures (broken bones) are not initially visible on x-rays.   For this reason, close outpatient follow-up with your primary care doctor or bone specialist (orthopedist) is required.    MEDICATIONS AND FOLLOWUP  Please be aware that some prescription medications can cause drowsiness.  Use caution when driving or operating machinery.    The examination and treatment you have received in our Emergency Department is provided on an emergency basis, and is not intended to be a substitute for your primary care physician.  It is important that your doctor checks you again and that you report any new or remaining problems at that time.      24 HOUR PHARMACIES  The nearest 24 hour pharmacy is:  [facility] [place], [place] [address]  [place] [facility]  [facility])  Call to start or finish an application, compare plans, enroll or ask a question.  [place]: [phone]  Web:  Healthcare.gov    Help Enrolling in [place]  [phone] (TOLL-FREE)  [phone] (TTY)  Web:  Http://www.coverva.org    Local Help Enrolling in the [place]  217-486-4256 (MAIN)  Email:  health-help'@nvfs'$ .org  Web:  http://lewis-perez.info/  Address:  [id number] [place], [place] [address]    SEDATING MEDICATIONS  Sedating medications include strong pain medications (e.g. narcotics), muscle relaxers, benzodiazepines (used for anxiety and as muscle relaxers), Benadryl/diphenhydramine and other antihistamines for allergic reactions/itching, and other medications.  If you are unsure if you have received a sedating medication, please ask your physician or nurse.  If you received a sedating medication: DO NOT drive a car. DO NOT operate machinery. DO NOT perform jobs where you need to be alert.  DO NOT drink alcoholic beverages while taking this medicine.     If you get dizzy, sit or lie down at the first signs. Be careful going up and down stairs.  Be extra careful  to prevent falls.     Never give this medicine to others.     Keep this medicine out of reach of children.     Do not take or save old medicines. Throw them away when outdated.     Keep all medicines in a cool, dry place. DO NOT keep them in your bathroom medicine cabinet or in a cabinet above the stove.    MEDICATION REFILLS  Please be aware that we cannot refill any prescriptions through the ER. If you need further treatment from what is provided at your ER visit, please follow up with your primary care doctor or your pain management specialist.    [place]  Did you know [facility] has two freestanding ERs located just a few miles away?  [facility] [place] [facility] [place] have short wait times, easy free parking directly in front of the building and top patient satisfaction scores - and the same Board Certified Emergency Medicine doctors as [facility].

## 2023-10-25 ENCOUNTER — Emergency Department (HOSPITAL_COMMUNITY)
Admission: EM | Admit: 2023-10-25 | Discharge: 2023-10-26 | Disposition: A | Discharge status: Other Acute Inpatient Hospital | Source: Home / Self Care | Attending: Student | Admitting: Student

## 2023-10-25 DIAGNOSIS — F1010 Alcohol abuse, uncomplicated: Secondary | ICD-10-CM | POA: Insufficient documentation

## 2023-10-25 DIAGNOSIS — F1910 Other psychoactive substance abuse, uncomplicated: Secondary | ICD-10-CM

## 2023-10-25 DIAGNOSIS — F17200 Nicotine dependence, unspecified, uncomplicated: Secondary | ICD-10-CM | POA: Insufficient documentation

## 2023-10-25 DIAGNOSIS — F1410 Cocaine abuse, uncomplicated: Secondary | ICD-10-CM | POA: Insufficient documentation

## 2023-10-25 DIAGNOSIS — F1994 Other psychoactive substance use, unspecified with psychoactive substance-induced mood disorder: Secondary | ICD-10-CM | POA: Insufficient documentation

## 2023-10-25 DIAGNOSIS — R45851 Suicidal ideations: Secondary | ICD-10-CM | POA: Insufficient documentation

## 2023-10-25 DIAGNOSIS — F1210 Cannabis abuse, uncomplicated: Secondary | ICD-10-CM | POA: Insufficient documentation

## 2023-10-25 LAB — CBC
HCT: 46.3 % (ref 39.0–52.0)
Hemoglobin: 15.7 g/dL (ref 13.0–17.0)
MCH: 31.3 pg (ref 26.0–34.0)
MCHC: 33.9 g/dL (ref 30.0–36.0)
MCV: 92.2 fL (ref 80.0–100.0)
Platelets: 358 10*3/uL (ref 150–400)
RBC: 5.02 MIL/uL (ref 4.22–5.81)
RDW: 14.9 % (ref 11.5–15.5)
WBC: 7.4 10*3/uL (ref 4.0–10.5)
nRBC: 0 % (ref 0.0–0.2)

## 2023-10-25 LAB — COMPREHENSIVE METABOLIC PANEL WITH GFR
ALT: 25 U/L (ref 0–44)
AST: 36 U/L (ref 15–41)
Albumin: 4.5 g/dL (ref 3.5–5.0)
Alkaline Phosphatase: 54 U/L (ref 38–126)
Anion gap: 9 (ref 5–15)
BUN: 14 mg/dL (ref 8–23)
CO2: 27 mmol/L (ref 22–32)
Calcium: 9.5 mg/dL (ref 8.9–10.3)
Chloride: 102 mmol/L (ref 98–111)
Creatinine, Ser: 1.18 mg/dL (ref 0.61–1.24)
GFR, Estimated: >60 mL/min (ref >60)
Glucose, Bld: 111 mg/dL — ABNORMAL HIGH (ref 70–99)
Potassium: 4.7 mmol/L (ref 3.5–5.1)
Sodium: 138 mmol/L (ref 135–145)
Total Bilirubin: 1.2 mg/dL (ref 0.0–1.2)
Total Protein: 7.9 g/dL (ref 6.5–8.1)

## 2023-10-25 LAB — ETHANOL: Alcohol, Ethyl (B): <15 mg/dL (ref <15)

## 2023-10-25 NOTE — ED Notes (Signed)
 Pt dressed out and wanded by security. Pt belongings in purple zone, locker #5

## 2023-10-25 NOTE — ED Triage Notes (Signed)
 Patient reports feeling suicidal after relapsing today. Patient reports he drank then did crack to sober up and feels bad for relapsing and wants to kill himself. Patient reports seeing dead family members while intoxicated that made him feel like he should join them. Tearful and anxious in triage.

## 2023-10-25 NOTE — ED Provider Notes (Signed)
 [place] EMERGENCY DEPARTMENT AT [facility] Provider Note   CSN: [id number] Arrival date & time: 10/25/23  2041     History  Chief Complaint  Patient presents with   Z04.6    Gary Guerrero is a 63 y.o. male with past medical history of anemia, polysubstance abuse, tobacco use presents emergency department for evaluation of SI with plan that started today.  He reports that he drank alcohol and has been smoking crack since Wednesday.  Reports that he has been drinking for 40 ounce icehouse beer.  While drinking yesterday, he reports that he started seeing his dead family members and thought "I want to join them".  This lasted for 3 minutes.  He reports that he has a plan to kill himself by either jumping in front of a train, jumping in a large body of water as he is unable to swim, or cutting his wrist. No current visual or auditory hallucinations. No complaints of HA, visual disturbances, fevers  HPI     Home Medications Prior to Admission medications   Medication Sig Start Date End Date Taking? Authorizing Provider  cyclobenzaprine  (FLEXERIL ) 10 MG tablet Take 1 tablet (10 mg total) by mouth 3 (three) times daily as needed for muscle spasms. Try 1/2 tablet (5mg ) before working hours. Patient not taking: Reported on 07/08/2016 01/27/14   Street, [place], PA-C  naproxen  (NAPROSYN ) 500 MG tablet Take 1 tablet (500 mg total) by mouth 2 (two) times daily as needed for mild pain or moderate pain (TAKE WITH MEALS AND ZANTAC DAILY). Patient not taking: Reported on 07/08/2016 01/27/14   Street, [place], PA-C  oxyCODONE -acetaminophen  (PERCOCET) 5-325 MG per tablet Take 1 tablet by mouth every 6 (six) hours as needed for severe pain. Patient not taking: Reported on 07/08/2016 01/27/14   Street, [place], [place]      Allergies    Hydrocodone and Ibuprofen    Review of Systems   Review of Systems  Constitutional:  Negative for chills, fatigue and fever.  Respiratory:  Negative for  cough, chest tightness, shortness of breath and wheezing.   Cardiovascular:  Negative for chest pain and palpitations.  Gastrointestinal:  Negative for abdominal pain, constipation, diarrhea, nausea and vomiting.  Neurological:  Negative for dizziness, seizures, weakness, light-headedness, numbness and headaches.  Psychiatric/Behavioral:  Positive for suicidal ideas.     Physical Exam Updated Vital Signs BP (!) 127/97 (BP Location: Right Arm)   Pulse 82   Temp 98.3 F (36.8 C) (Oral)   Resp 15   SpO2 100%  Physical Exam Vitals and nursing note reviewed.  Constitutional:      General: He is not in acute distress.    Appearance: Normal appearance.  HENT:     Head: Normocephalic and atraumatic.  Eyes:     Conjunctiva/sclera: Conjunctivae normal.  Cardiovascular:     Rate and Rhythm: Normal rate.  Pulmonary:     Effort: Pulmonary effort is normal. No respiratory distress.  Musculoskeletal:     Right lower leg: No edema.     Left lower leg: No edema.  Skin:    General: Skin is warm.     Capillary Refill: Capillary refill takes less than 2 seconds.     Coloration: Skin is not jaundiced or pale.  Neurological:     Mental Status: He is alert and oriented to person, place, and time. Mental status is at baseline.     Cranial Nerves: No cranial nerve deficit.     Sensory: No sensory  deficit.     Motor: No weakness.     Coordination: Coordination normal.     Gait: Gait normal.     Deep Tendon Reflexes: Reflexes normal.  Psychiatric:        Attention and Perception: Attention normal. He does not perceive auditory or visual hallucinations.        Mood and Affect: Mood is depressed.        Behavior: Behavior is withdrawn.        Thought Content: Thought content is not paranoid or delusional. Thought content includes suicidal ideation. Thought content does not include homicidal ideation. Thought content includes suicidal plan. Thought content does not include homicidal plan.         Cognition and Memory: Cognition and memory normal.     ED Results / Procedures / Treatments   Labs (all labs ordered are listed, but only abnormal results are displayed) Labs Reviewed  COMPREHENSIVE METABOLIC PANEL WITH GFR - Abnormal; Notable for the following components:      Result Value   Glucose, Bld 111 (*)    All other components within normal limits  ETHANOL  CBC  RAPID URINE DRUG SCREEN, HOSP PERFORMED    EKG None  Radiology No results found.  Procedures Procedures    Medications Ordered in ED Medications - No data to display  ED Course/ Medical Decision Making/ A&P                                 Medical Decision Making Amount and/or Complexity of Data Reviewed Labs: ordered.   Patient presents to the ED for concern of polysubstance abuse, SI, this involves an extensive number of treatment options, and is a complaint that carries with it a high risk of complications and morbidity.  The differential diagnosis includes alcohol withdrawal, DT, drug toxidrome, electrolyte derangement, infection, hypoglycemia, hyperglycemia, psychiatric disorder   Co morbidities that complicate the patient evaluation  None   Additional history obtained:  Additional history obtained from Nursing   External records from outside source obtained and reviewed including triage RN note   Lab Tests:  I Ordered, and personally interpreted labs.  The pertinent results include:   CBG 111     Problem List / ED Course:  Polysubstance abuse SI w/ plan Reports that he has been homeless for the past month and has been residing with a girlfriend who drinks daily.  He believes that this has contributed to his alcohol and drug use recently Currently complaining of SI with multiple plans.  Has not tried any self injury. Had an episode of psychosis when he noted a 3-minute episode of seeing his dead family members however he has no current visual or auditory hallucinations.  No  complaints of headache or visual disturbances.  He is neurologically intact. Low suspicion for DT, alcohol withdrawal.  Does not appear anxious.  No tremors.  No tactile disturbances.  No history of this either. Medically cleared awaiting TTS consult   Reevaluation:  After the interventions noted above, I reevaluated the patient and found that they have :stayed the same    Dispostion:  Patient has no additional medical complaints other than SI with plan at this time.  ED workup notable for CBG of 111.  UDS pending.  He is medically cleared for TTS consult.  After consideration of the diagnostic results and the patients response to treatment, I feel that the patent would benefit  from TTS consult.   Discussed ED workup, disposition with patient expresses understanding agrees with plan. Final Clinical Impression(s) / ED Diagnoses Final diagnoses:  Polysubstance abuse [facility])  Suicidal ideation    Rx / DC Orders ED Discharge Orders     None         Royann Cords, PA 10/25/23 2336    Karlyn Overman, MD 10/26/23 2233

## 2023-10-26 ENCOUNTER — Encounter (HOSPITAL_COMMUNITY): Payer: Self-pay | Admitting: Psychiatry

## 2023-10-26 ENCOUNTER — Other Ambulatory Visit: Payer: Self-pay

## 2023-10-26 ENCOUNTER — Inpatient Hospital Stay (HOSPITAL_COMMUNITY)
Admission: AD | Admit: 2023-10-26 | Discharge: 2023-11-03 | DRG: 897 | Disposition: A | Discharge status: Home / Self Care | Source: Intra-hospital | Attending: Psychiatry | Admitting: Psychiatry

## 2023-10-26 DIAGNOSIS — F1410 Cocaine abuse, uncomplicated: Secondary | ICD-10-CM

## 2023-10-26 DIAGNOSIS — Z9151 Personal history of suicidal behavior: Secondary | ICD-10-CM | POA: Diagnosis not present

## 2023-10-26 DIAGNOSIS — I10 Essential (primary) hypertension: Secondary | ICD-10-CM | POA: Diagnosis present

## 2023-10-26 DIAGNOSIS — Z79899 Other long term (current) drug therapy: Secondary | ICD-10-CM

## 2023-10-26 DIAGNOSIS — R45851 Suicidal ideations: Secondary | ICD-10-CM | POA: Diagnosis present

## 2023-10-26 DIAGNOSIS — F1994 Other psychoactive substance use, unspecified with psychoactive substance-induced mood disorder: Secondary | ICD-10-CM | POA: Diagnosis present

## 2023-10-26 DIAGNOSIS — F17210 Nicotine dependence, cigarettes, uncomplicated: Secondary | ICD-10-CM | POA: Diagnosis present

## 2023-10-26 DIAGNOSIS — F1914 Other psychoactive substance abuse with psychoactive substance-induced mood disorder: Principal | ICD-10-CM | POA: Diagnosis present

## 2023-10-26 DIAGNOSIS — Z5900 Homelessness unspecified: Secondary | ICD-10-CM

## 2023-10-26 DIAGNOSIS — Z888 Allergy status to other drugs, medicaments and biological substances status: Secondary | ICD-10-CM | POA: Diagnosis not present

## 2023-10-26 DIAGNOSIS — F17290 Nicotine dependence, other tobacco product, uncomplicated: Secondary | ICD-10-CM | POA: Diagnosis present

## 2023-10-26 DIAGNOSIS — Z8711 Personal history of peptic ulcer disease: Secondary | ICD-10-CM | POA: Diagnosis not present

## 2023-10-26 DIAGNOSIS — F32A Depression, unspecified: Secondary | ICD-10-CM | POA: Diagnosis present

## 2023-10-26 DIAGNOSIS — F419 Anxiety disorder, unspecified: Secondary | ICD-10-CM | POA: Diagnosis present

## 2023-10-26 DIAGNOSIS — F1010 Alcohol abuse, uncomplicated: Secondary | ICD-10-CM

## 2023-10-26 DIAGNOSIS — F1210 Cannabis abuse, uncomplicated: Secondary | ICD-10-CM | POA: Diagnosis not present

## 2023-10-26 DIAGNOSIS — Z885 Allergy status to narcotic agent status: Secondary | ICD-10-CM

## 2023-10-26 LAB — RAPID URINE DRUG SCREEN, HOSP PERFORMED
Amphetamines: NOT DETECTED
Barbiturates: NOT DETECTED
Benzodiazepines: NOT DETECTED
Cocaine: POSITIVE — AB
Opiates: NOT DETECTED
Tetrahydrocannabinol: POSITIVE — AB

## 2023-10-26 MED ORDER — DIPHENHYDRAMINE HCL 50 MG/ML IJ SOLN: 50.0000 mg | Freq: Three times a day (TID) | INTRAMUSCULAR | Status: DC | PRN

## 2023-10-26 MED ORDER — HALOPERIDOL LACTATE 5 MG/ML IJ SOLN: 5.0000 mg | Freq: Three times a day (TID) | INTRAMUSCULAR | Status: DC | PRN

## 2023-10-26 MED ORDER — HYDROXYZINE HCL 25 MG PO TABS: 25.0000 mg | ORAL_TABLET | Freq: Four times a day (QID) | ORAL | Status: AC | PRN

## 2023-10-26 MED ORDER — ACETAMINOPHEN 325 MG PO TABS: 650.0000 mg | ORAL_TABLET | Freq: Four times a day (QID) | ORAL | Status: DC | PRN

## 2023-10-26 MED ORDER — ALUM & MAG HYDROXIDE-SIMETH 200-200-20 MG/5ML PO SUSP
30.0000 mL | ORAL | Status: DC | PRN
  Administered 2023-11-02: 30 mL via ORAL
  Filled 2023-10-26: qty 30

## 2023-10-26 MED ORDER — LORAZEPAM 2 MG/ML IJ SOLN: 2.0000 mg | Freq: Three times a day (TID) | INTRAMUSCULAR | Status: DC | PRN

## 2023-10-26 MED ORDER — QUETIAPINE FUMARATE 25 MG PO TABS
25.0000 mg | ORAL_TABLET | Freq: Every day | ORAL | Status: DC
  Filled 2023-10-26: qty 1

## 2023-10-26 MED ORDER — QUETIAPINE FUMARATE 25 MG PO TABS: 25.0000 mg | ORAL_TABLET | Freq: Every day | ORAL | Status: DC

## 2023-10-26 MED ORDER — NICOTINE 14 MG/24HR TD PT24: 14.0000 mg | MEDICATED_PATCH | Freq: Every day | TRANSDERMAL | Status: DC

## 2023-10-26 MED ORDER — HALOPERIDOL LACTATE 5 MG/ML IJ SOLN: 10.0000 mg | Freq: Three times a day (TID) | INTRAMUSCULAR | Status: DC | PRN

## 2023-10-26 MED ORDER — ADULT MULTIVITAMIN W/MINERALS CH
1.0000 | ORAL_TABLET | Freq: Every day | ORAL | Status: DC
  Administered 2023-10-26 – 2023-11-03 (×9): 1 via ORAL
  Filled 2023-10-26 (×9): qty 1

## 2023-10-26 MED ORDER — ONDANSETRON 4 MG PO TBDP: 4.0000 mg | ORAL_TABLET | Freq: Four times a day (QID) | ORAL | Status: AC | PRN

## 2023-10-26 MED ORDER — SERTRALINE HCL 25 MG PO TABS
25.0000 mg | ORAL_TABLET | Freq: Every day | ORAL | Status: DC
  Administered 2023-10-26: 25 mg via ORAL
  Filled 2023-10-26: qty 1

## 2023-10-26 MED ORDER — VITAMIN B-1 100 MG PO TABS
100.0000 mg | ORAL_TABLET | Freq: Every day | ORAL | Status: DC
  Administered 2023-10-27 – 2023-11-03 (×8): 100 mg via ORAL
  Filled 2023-10-26 (×2): qty 1
  Filled 2023-10-26: qty 14
  Filled 2023-10-26 (×6): qty 1

## 2023-10-26 MED ORDER — DIPHENHYDRAMINE HCL 25 MG PO CAPS: 50.0000 mg | ORAL_CAPSULE | Freq: Three times a day (TID) | ORAL | Status: DC | PRN

## 2023-10-26 MED ORDER — MAGNESIUM HYDROXIDE 400 MG/5ML PO SUSP: 30.0000 mL | Freq: Every day | ORAL | Status: DC | PRN

## 2023-10-26 MED ORDER — HALOPERIDOL 5 MG PO TABS: 5.0000 mg | ORAL_TABLET | Freq: Three times a day (TID) | ORAL | Status: DC | PRN

## 2023-10-26 MED ORDER — SERTRALINE HCL 25 MG PO TABS
25.0000 mg | ORAL_TABLET | Freq: Every day | ORAL | Status: DC
  Administered 2023-10-27: 25 mg via ORAL
  Filled 2023-10-26: qty 1

## 2023-10-26 MED ORDER — THIAMINE HCL 100 MG/ML IJ SOLN
100.0000 mg | Freq: Once | INTRAMUSCULAR | Status: AC
  Administered 2023-10-26: 100 mg via INTRAMUSCULAR
  Filled 2023-10-26: qty 2

## 2023-10-26 MED ORDER — LORAZEPAM 1 MG PO TABS: 1.0000 mg | ORAL_TABLET | Freq: Four times a day (QID) | ORAL | Status: AC | PRN

## 2023-10-26 MED ORDER — LOPERAMIDE HCL 2 MG PO CAPS: 2.0000 mg | ORAL_CAPSULE | ORAL | Status: AC | PRN

## 2023-10-26 NOTE — ED Notes (Addendum)
 Nickolas told report needed at [facility], Ihallati RN of  [facility] given report on pt. No questions or concerns at this time.

## 2023-10-26 NOTE — Group Note (Unsigned)
 Therapy Group Note  Group Topic:Other  Group Date: 10/26/2023 Start Time: 1430 End Time: 1518 Facilitators: Filip Luten G, OT    Group OT session titled "Media Detox Challenge" was conducted with approximately 20 adolescent patients in the inpatient behavioral health unit. The group focused on exploring the impact of media/screen use on emotional regulation, occupational balance, and routine development. Patients were provided with structured handouts and guided through a step-by-step process that included individual reflection, small group planning, and large group discussion. Tasks included identifying current screen time habits, emotional effects of media use, and collaboratively designing a 24-hour "media detox" plan with non-screen-based alternatives.      Participation Level: {OT BHH Participation RUEAV:40981}   Participation Quality: {OT BHH Participation Quality:26268}   Behavior: {BHH OT Group Behavior:26269}   Speech/Thought Process: {BHH OT Speech/Thought Process:26270}   Affect/Mood: {OT BHH Affect/Mood:26271}   Insight: {OT BHH Insight:26272}   Judgement: {OT BHH Judgement:26272}   Individualization: *** was *** in their participation of group discussion/activity. *** identified  Modes of Intervention: {BHH MODES OF INTERVENTION:26273}  Patient Response to Interventions:  {BHH OT Patient Response to Interventions:26274}   Plan: Continue to engage patient in OT groups 2 - 3x/week.  10/26/2023  Lynnda Sas, OT
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
## 2023-10-26 NOTE — ED Notes (Signed)
 Number called for report [phone]

## 2023-10-26 NOTE — BH Assessment (Signed)
 TTS consult will be completed by IRIS. IRIS Coordinator will communicate in established secure chat assessment time and provider name. Thanks

## 2023-10-26 NOTE — Tx Team (Signed)
 Initial Treatment Plan 10/26/2023 4:27 PM [name] [id number]    PATIENT STRESSORS: Health problems   Substance abuse     PATIENT STRENGTHS: Capable of independent living  Communication skills    PATIENT IDENTIFIED PROBLEMS: Substance abuse  Housing concerns                   DISCHARGE CRITERIA:  Ability to meet basic life and health needs  PRELIMINARY DISCHARGE PLAN: Return to previous work or school arrangements  PATIENT/FAMILY INVOLVEMENT: This treatment plan has been presented to and reviewed with the patient, Gary Guerrero, and/or family member.  The patient and family have been given the opportunity to ask questions and make suggestions.  Geri Ko, RN 10/26/2023, 4:27 PM

## 2023-10-26 NOTE — BH Assessment (Signed)
 At 0539, clinician messaged IRIS any updates to when the pt will be assessed? Awaiting a response.    Rosi Converse, MS, [facility], [facility] Triage Specialist [phone]
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
## 2023-10-26 NOTE — Progress Notes (Signed)
 Adult Psychoeducational Group Note  Date:  10/26/2023 Time:  8:49 PM  Group Topic/Focus:  Wrap-Up Group:   The focus of this group is to help patients review their daily goal of treatment and discuss progress on daily workbooks.  Participation Level:  Active  Participation Quality:  Appropriate  Affect:  Appropriate  Cognitive:  Appropriate  Insight: Appropriate  Engagement in Group:  Engaged  Modes of Intervention:  Discussion  Additional Comments:  the one positive that happen today meet his roommate and a good experience.  Gary Guerrero 10/26/2023, 8:49 PM

## 2023-10-27 LAB — LIPID PANEL
Cholesterol: 173 mg/dL (ref 0–200)
HDL: 77 mg/dL (ref >40)
LDL Cholesterol: 83 mg/dL (ref 0–99)
Total CHOL/HDL Ratio: 2.2 ratio
Triglycerides: 65 mg/dL (ref <150)
VLDL: 13 mg/dL (ref 0–40)

## 2023-10-27 LAB — VITAMIN D 25 HYDROXY (VIT D DEFICIENCY, FRACTURES): Vit D, 25-Hydroxy: 55.77 ng/mL (ref 30–100)

## 2023-10-27 LAB — GAMMA GT: GGT: 16 U/L (ref 7–50)

## 2023-10-27 LAB — HEMOGLOBIN A1C
Hgb A1c MFr Bld: 5.3 % (ref 4.8–5.6)
Mean Plasma Glucose: 105.41 mg/dL

## 2023-10-27 LAB — TSH: TSH: 1.274 u[IU]/mL (ref 0.350–4.500)

## 2023-10-27 LAB — VITAMIN B12: Vitamin B-12: 1383 pg/mL — ABNORMAL HIGH (ref 180–914)

## 2023-10-27 LAB — FOLATE: Folate: 19.3 ng/mL (ref >5.9)

## 2023-10-27 LAB — HIV ANTIBODY (ROUTINE TESTING W REFLEX): HIV Screen 4th Generation wRfx: NONREACTIVE

## 2023-10-27 MED ORDER — SERTRALINE HCL 50 MG PO TABS
50.0000 mg | ORAL_TABLET | Freq: Every day | ORAL | Status: DC
  Filled 2023-10-27: qty 1
  Filled 2023-10-27: qty 14

## 2023-10-27 MED ORDER — ENSURE PLUS HIGH PROTEIN PO LIQD
237.0000 mL | Freq: Two times a day (BID) | ORAL | Status: DC
  Administered 2023-10-28 – 2023-11-02 (×11): 237 mL via ORAL
  Filled 2023-10-27 (×8): qty 237

## 2023-10-27 MED ORDER — QUETIAPINE FUMARATE 50 MG PO TABS
50.0000 mg | ORAL_TABLET | Freq: Every day | ORAL | Status: DC
  Filled 2023-10-27: qty 1

## 2023-10-27 MED ORDER — NALTREXONE HCL 50 MG PO TABS
50.0000 mg | ORAL_TABLET | Freq: Every day | ORAL | Status: DC
  Administered 2023-10-27 – 2023-11-01 (×5): 50 mg via ORAL
  Filled 2023-10-27 (×2): qty 1
  Filled 2023-10-27: qty 14
  Filled 2023-10-27 (×3): qty 1

## 2023-10-27 NOTE — H&P (Cosign Needed Addendum)
 Psychiatric Admission Assessment Adult  Patient Identification: Gary Guerrero MRN:  [id number] Date of Evaluation:  10/27/2023  Chief Complaint:  Substance induced mood disorder (HCC) [F19.94],  Substance induced mood disorder (HCC)  Principal Problem:   Substance induced mood disorder (HCC)   History of Present Illness:  Gary Guerrero is a 63 y.o., male with substance use history of EtOH, Cocaine, and Cannabis and past psychiatric hx of substance induced mood disorder who presents to the [facility] Voluntary from [facility] Emergency Department for evaluation and management of SI, depressive mood with psychotic features. UDS positive for Cocaine and THC.   Per chart review, patient arrived at the ED yesterday endorsing suicidal ideations, depressed mood and psychotic features (seeing and hearing dead family members) in the context of relapse of illicit substances. Negative ETOH. Per Heyward Loud, NP, the patient relapsed on alcohol, cocaine and cannabis last Wednesday, and since then has experienced worsening depressive mood, growing suicidal thoughts, and AVH of seeing dead family members. For about 10 years, patient has been struggling with sobriety. Before this relapse, he was clean for 3 months, and before then, 3 years. Patient endorses a history of rehabilitation services in [place]  a couple years ago where he has been living over the last several years  Patient agreed to be voluntarily committed.    On interview, patient was very vague and was a poor historian.  Patient reports that he started drinking last month when he moved back into town from [place] with a male friend. She had alcohol at the house. He reports that his SI was caused by his drinking since he did not have these thoughts while he was sober. Going further into his alcohol use, he endorses experiencing withdrawal tremors in the past as well as hallucinations but denies seizures. He denies any  psychiatric diagnoses or inpatient hospitalizations but per chart review, he was previously hospitalized in [facility] in 2011 for substance-induced mood disorder with SI. He describes seeing his dead family members 4 days ago, no hallucinations since then. These hallucinations started a few months ago and they get worse when he drinks and the voices tell him to hurt himself and hurt others. He reports hearing the voices in his sleep (this is vanishingly rare except in cases of malingering.) Patient admits that "I need some help." He also added that he "feel a little paranoid of going back home" and he is scared "of getting hurt."   Today, the patient is endorsing low mood, poor sleep, irritability, hopelessness, poor appetite, and increased restlessness. He denies any history of mania. He denies current SI/AVH, and when prompted for HI, said "Yes for certain people, but I don't have a plan." He does not report any delusions. He does report occasional hypervigilance since growing up around gun violence, but no nightmares or periodic flashbacks. Patient did not want the team to contact anyone for collateral.     Subjective Sleep past 24 hours: fair Subjective Appetite past 24 hours: poor  Collateral information obtained (N/A) Patient did not grant permission to obtain collateral information.    Past Psychiatric History:  Previous psych diagnoses: Substance induced mood disorder, Alcohol use disorder, Cocaine use disorder, Cannabis use disorder Prior inpatient psychiatric treatment: admitted for Depression with SI in 2011 at [facility] Prior outpatient psychiatric treatment: Denies Current psychiatric provider: Denies  Neuromodulation history: denies  Current therapist: Denies Psychotherapy hx: Denies  History of suicide attempts: 1 intentional overdose in 2005 History of homicide: Denies  Psychotropic  medications: Current None  Past Seroquel - patient reports good response Zoloft - patient reports  good response  Substance Use History: Alcohol: drinks beer and malt liquor every day, 4 forty ounces. Last drink was 10/25/2023 Hx withdrawal tremors/shakes: endorses Hx alcohol related blackouts: denies Hx alcohol induced hallucinations: endorses Hx alcoholic seizures: denies Hx medical hospitalization due to severe alcohol withdrawal symptoms: denies DUI: denies  --------  Tobacco: Smoking for the last 10 years. Only smokes when he drinks  Cannabis (marijuana): Endorses smoking 3-4 cigars every day  Cocaine: Endorses using crack cocaine. Uses to "sober up" from alcohol  Methamphetamines: never tried Psilocybin (mushrooms): never tried Ecstasy (MDMA / molly): never tried LSD (acid): never tried Opiates (fentanyl / heroin): never tried Benzos (Xanax, Klonopin): never tried IV drug use: denies Prescribed meds abuse: denies  History of detox: denies History of rehab: attempted rehab from a clinic in [place] with fair results. This was "5 years" ago   Is the patient at risk to self? No Has the patient been a risk to self in the past 6 months? Yes Has the patient been a risk to self within the distant past? Yes Is the patient a risk to others? No Has the patient been a risk to others in the past 6 months? No Has the patient been a risk to others within the distant past? No  Alcohol Screening: 1. How often do you have a drink containing alcohol?: Monthly or less 2. How many drinks containing alcohol do you have on a typical day when you are drinking?: 3 or 4 3. How often do you have six or more drinks on one occasion?: Never AUDIT-C Score: 2 4. How often during the last year have you found that you were not able to stop drinking once you had started?: Never 5. How often during the last year have you failed to do what was normally expected from you because of drinking?: Never 6. How often during the last year have you needed a first drink in the morning to get yourself going  after a heavy drinking session?: Never 7. How often during the last year have you had a feeling of guilt of remorse after drinking?: Less than monthly 8. How often during the last year have you been unable to remember what happened the night before because you had been drinking?: Never 9. Have you or someone else been injured as a result of your drinking?: No 10. Has a relative or friend or a doctor or another health worker been concerned about your drinking or suggested you cut down?: No Alcohol Use Disorder Identification Test Final Score (AUDIT): 3 Alcohol Brief Interventions/Follow-up: Alcohol education/Brief advice Tobacco Screening:    Substance Abuse History in the last 12 months: Yes  Allergies: endorses the following medication allergies with resulting symptoms: Hydrocodone (Swelling and Rash), NSAIDs (GI upset)   Past Medical/Surgical History:  Medical Diagnoses: N/A Home Rx: None Prior Hosp: Denies Prior Surgeries / non-head trauma: Denies  Head trauma: denies LOC: denies Concussions: endorses. "Couple concussions" Seizures: denies  Last menstrual period and contraceptives: N/A  Family History:  Medical: N/A Psych: None endorsed Psych Rx: None endorsed Suicide: Non endorsed Homicide: None endorsed  Substance use family hx: Says a lot of his family "uses"  Social History:  Place of birth and grew up where: Grew up in [place].  Abuse: no history of abuse Marital Status: single Sexual orientation: straight Children: 2 grown children  Employment: retired Investment banker, operational  Highest level  of education: some college, no degree Housing: living with a friend, rents  Finances: retirement / pension income and Tourist information centre manager: no Special educational needs teacher: never served Consulting civil engineer: denies owning any firearms but reports the the "friend" he is staying with owns a Chief of Staff  Pills stockpile: Denies  Lab Results:  Results for orders placed or performed during the hospital encounter of  10/25/23 (from the past 48 hours)  Rapid urine drug screen (hospital performed)     Status: Abnormal   Collection Time: 10/25/23  9:04 PM  Result Value Ref Range   Opiates NONE DETECTED NONE DETECTED   Cocaine POSITIVE (A) NONE DETECTED   Benzodiazepines NONE DETECTED NONE DETECTED   Amphetamines NONE DETECTED NONE DETECTED   Tetrahydrocannabinol POSITIVE (A) NONE DETECTED   Barbiturates NONE DETECTED NONE DETECTED    Comment: (NOTE) DRUG SCREEN FOR MEDICAL PURPOSES ONLY.  IF CONFIRMATION IS NEEDED FOR ANY PURPOSE, NOTIFY LAB WITHIN 5 DAYS.  LOWEST DETECTABLE LIMITS FOR URINE DRUG SCREEN Drug Class                     Cutoff (ng/mL) Amphetamine and metabolites    1000 Barbiturate and metabolites    200 Benzodiazepine                 200 Opiates and metabolites        300 Cocaine and metabolites        300 THC                            50 Performed at [facility] Lab, [address], [place], [place] [address]   Comprehensive metabolic panel     Status: Abnormal   Collection Time: 10/25/23  9:08 PM  Result Value Ref Range   Sodium 138 135 - 145 mmol/L   Potassium 4.7 3.5 - 5.1 mmol/L   Chloride 102 98 - 111 mmol/L   CO2 27 22 - 32 mmol/L   Glucose, Bld 111 (H) 70 - 99 mg/dL    Comment: Glucose reference range applies only to samples taken after fasting for at least 8 hours.   BUN 14 8 - 23 mg/dL   Creatinine, Ser 5.28 0.61 - 1.24 mg/dL   Calcium 9.5 8.9 - 41.3 mg/dL   Total Protein 7.9 6.5 - 8.1 g/dL   Albumin 4.5 3.5 - 5.0 g/dL   AST 36 15 - 41 U/L   ALT 25 0 - 44 U/L   Alkaline Phosphatase 54 38 - 126 U/L   Total Bilirubin 1.2 0.0 - 1.2 mg/dL   GFR, Estimated >24 >40 mL/min    Comment: (NOTE) Calculated using the CKD-EPI Creatinine Equation (2021)    Anion gap 9 5 - 15    Comment: Performed at [facility], [address], [place], [place] [address]  Ethanol     Status: None   Collection Time: 10/25/23  9:08 PM  Result Value Ref Range   Alcohol, Ethyl  (B) <15 <15 mg/dL    Comment: (NOTE) For medical purposes only. Performed at [facility], [address] [place], [place] [address]   cbc     Status: None   Collection Time: 10/25/23  9:08 PM  Result Value Ref Range   WBC 7.4 4.0 - 10.5 K/uL   RBC 5.02 4.22 - 5.81 MIL/uL   Hemoglobin 15.7 13.0 - 17.0 g/dL   HCT 46.3  39.0 - 52.0 %   MCV 92.2 80.0 - 100.0 fL   MCH 31.3 26.0 - 34.0 pg   MCHC 33.9 30.0 - 36.0 g/dL   RDW 42.5 95.6 - 38.7 %   Platelets 358 150 - 400 K/uL   nRBC 0.0 0.0 - 0.2 %    Comment: Performed at [facility] Lab, [address] [place], [place] [address]    Blood Alcohol level:  Lab Results  Component Value Date   [facility] <15 10/25/2023   [facility]  05/19/2010    <5        LOWEST DETECTABLE LIMIT FOR SERUM ALCOHOL IS 5 mg/dL FOR MEDICAL PURPOSES ONLY    Metabolic Disorder Labs:  Lab Results  Component Value Date   HGBA1C  01/21/2009    5.4 (NOTE) The ADA recommends the following therapeutic goal for glycemic control related to Hgb A1c measurement: Goal of therapy: <6.5 Hgb A1c  Reference: American Diabetes Association: Clinical Practice Recommendations 2010, Diabetes Care, 2010, 33: (Suppl  1).   MPG 108 01/21/2009   No results found for: "PROLACTIN" Lab Results  Component Value Date   CHOL  01/21/2009    86        ATP III CLASSIFICATION:  <200     mg/dL   Desirable  295-188  mg/dL   Borderline High  >=416    mg/dL   High          TRIG 58 01/21/2009   HDL 13 (L) 01/21/2009   CHOLHDL 6.6 01/21/2009   VLDL 12 01/21/2009   LDLCALC  01/21/2009    61        Total Cholesterol/HDL:CHD Risk Coronary Heart Disease Risk Table                     Men   Women  1/2 Average Risk   3.4   3.3  Average Risk       5.0   4.4  2 X Average Risk   9.6   7.1  3 X Average Risk  23.4   11.0        Use the calculated Patient Ratio above and the CHD Risk Table to determine the patient's CHD Risk.        ATP III CLASSIFICATION (LDL):  <100     mg/dL    Optimal  606-301  mg/dL   Near or Above                    Optimal  130-159  mg/dL   Borderline  601-093  mg/dL   High  >235     mg/dL   Very High    Current Medications: Current Facility-Administered Medications  Medication Dose Route Frequency Provider Last Rate Last Admin   acetaminophen  (TYLENOL ) tablet 650 mg  650 mg Oral Q6H PRN Volanda Gruber, NP       alum & mag hydroxide-simeth (MAALOX/MYLANTA) 200-200-20 MG/5ML suspension 30 mL  30 mL Oral Q4H PRN Volanda Gruber, NP       haloperidol (HALDOL) tablet 5 mg  5 mg Oral TID PRN Volanda Gruber, NP       And   diphenhydrAMINE (BENADRYL) capsule 50 mg  50 mg Oral TID PRN Volanda Gruber, NP       haloperidol lactate (HALDOL) injection 5 mg  5 mg Intramuscular TID PRN Volanda Gruber, NP       And   diphenhydrAMINE (BENADRYL) injection  50 mg  50 mg Intramuscular TID PRN Volanda Gruber, NP       And   LORazepam (ATIVAN) injection 2 mg  2 mg Intramuscular TID PRN Volanda Gruber, NP       haloperidol lactate (HALDOL) injection 10 mg  10 mg Intramuscular TID PRN Stevens, Terry J, NP       And   diphenhydrAMINE (BENADRYL) injection 50 mg  50 mg Intramuscular TID PRN Stevens, Terry J, NP       And   LORazepam (ATIVAN) injection 2 mg  2 mg Intramuscular TID PRN Volanda Gruber, NP       hydrOXYzine (ATARAX) tablet 25 mg  25 mg Oral Q6H PRN Volanda Gruber, NP       loperamide (IMODIUM) capsule 2-4 mg  2-4 mg Oral PRN Volanda Gruber, NP       LORazepam (ATIVAN) tablet 1 mg  1 mg Oral Q6H PRN Volanda Gruber, NP       magnesium hydroxide (MILK OF MAGNESIA) suspension 30 mL  30 mL Oral Daily PRN Volanda Gruber, NP       multivitamin with minerals tablet 1 tablet  1 tablet Oral Daily Volanda Gruber, NP   1 tablet at 10/27/23 1022   ondansetron (ZOFRAN-ODT) disintegrating tablet 4 mg  4 mg Oral Q6H PRN Volanda Gruber, NP       QUEtiapine (SEROQUEL) tablet 25 mg  25 mg Oral QHS Volanda Gruber, NP       sertraline  (ZOLOFT) tablet 25 mg  25 mg Oral Daily Volanda Gruber, NP   25 mg at 10/27/23 1021   thiamine (Vitamin B-1) tablet 100 mg  100 mg Oral Daily Volanda Gruber, NP   100 mg at 10/27/23 1021    PTA Medications: No medications prior to admission.    Physical Findings: AIMS: No  CIWA:  CIWA-Ar Total: 0 COWS:     Psychiatric Specialty Exam: General Appearance: Casual   Eye Contact: Fair  Speech: Clear and Coherent  Volume: Normal  Mood: Depressed  Affect: Constricted   Thought Content: Logical  Suicidal Thoughts: Denies current SI. Did have SI w/o plan yesterday   Homicidal Thoughts: Yes, for certain people with no plan  Thought Process: Coherent. Slightly tangential  Orientation: Full     Memory: WDL   Judgment: Intact  Insight: Fair  Concentration: Fair  Recall: Fair  Fund of Knowledge: N/A  Language: Fair  Psychomotor Activity: Normal  Assets: Communication Skills, Resilience  Sleep: Fair   Review of Systems Review of Systems  Constitutional:  Negative for chills and fever.  HENT: Negative.    Eyes:        Was covering his eyes in the room   Respiratory: Negative.    Cardiovascular: Negative.   Genitourinary: Negative.   Skin: Negative.   Psychiatric/Behavioral:  Positive for depression and substance abuse. Negative for suicidal ideas.     Vital signs: Blood pressure (!) 127/91, pulse 86, temperature 98.2 F (36.8 C), temperature source Oral, resp. rate 18, height 6' (1.829 m), weight 65.8 kg, SpO2 99%. Body mass index is 19.67 kg/m. Physical Exam HENT:     Head: Normocephalic.  Pulmonary:     Effort: Pulmonary effort is normal.  Neurological:     General: No focal deficit present.     Mental Status: He is alert.     Assets  Assets: Communication Skills, Desire for Improvement, Resilience  Treatment Plan Summary: Daily contact with patient to assess and evaluate symptoms and progress in treatment and medication management  ASSESSMENT: Gary  Guerrero is a 63 YO male with significant substance use history of alcohol use for >10 years, chronic cocaine and cannabis use with a psychiatric history of substance induced mood disorder who presents voluntary from the ED. Given his poor sleep, low mood, irritability, feelings of hopelessness, and poor appetite over the last 2 weeks in the setting of his relapse, his recent SI is due to a substance induced mood disorder. Also on the differential is MDD with psychotic features and hypothyroidism.  His primary concern is his substance use disorder, from which he has difficulty staying sober. He is open to trying inpatient rehab again -- it appears to have worked well for him in the past. It also appears that his hallucinations are tied in with his substance use (alcohol in the setting of cocaine). As patient last endorses visual hallucinations last Saturday 5/25, it is likely that this is also related to his cocaine/alcohol use. (Last drink of alcohol of 5 40oz bottles was also last Saturday, ~96 hours ago).  We will continue patient on Zoloft for his depressive mood and increase Seroquel to 50 mg nightly to treat hallucinations.  It is almost certain that his SI is situational regarding his substance use and possibly housing.  It is unclear to what extent current presentation is malingering with secondary gain for housing.  Denies SI presently.   these diagnoses are provisional diagnoses and subject to change as the patient's clinical picture evolves or new information is revealed, including substances (drugs of abuse, medications), another medical condition, or better explained by another psychiatric diagnosis.  PLAN: Safety and Monitoring:  -- Voluntary admission to inpatient psychiatric unit for safety, stabilization and treatment  -- Daily contact with patient to assess and evaluate symptoms and progress in treatment  -- Patient's case to be discussed in multi-disciplinary team meeting  -- Observation  Level : q15 minute checks  -- Vital signs: q12 hours  -- Precautions: suicide, elopement, and assault  2. Interventions (medications, psychoeducation, etc):    -- Patient in need of nicotine replacement; nicotine patch 7 mg / 24 hours ordered. Smoking cessation encouraged  #Substance Induced Mood Disorder, depressed episode, with psychotic features -- Increase Zoloft 25 --> 50 mg qhs -- Increase Seroquel 25 --> 50 mg qhs  #Substance Use Disorder-EtOH -- CIWA with symptom-triggered lorazepam protocol -- Start Naltrexone 50 mg -- Patient interested in residential substance use treatment.   #Stimulant Use Disorder, Cocaine Type #Cannabis Use Disorder, Cannabis   -- Counsel abstinence.   #Labs to order -- TSH, folate, GGT, A1c, HIV, Lipid, RPR, B12, Vitamin D   PRN medications for symptomatic management:              -- start acetaminophen  650 mg every 6 hours as needed for mild to moderate pain, fever, and headaches              -- start hydroxyzine 25 mg three times a day as needed for anxiety              -- start bismuth subsalicylate 524 mg oral chewable tablet every 3 hours as needed for indigestion              -- start senna 8.6 mg oral at bedtime as needed and polyethylene glycol 17 g oral daily as needed for mild to moderate constipation              --  start ondansetron 8 mg every 8 hours as needed for nausea or vomiting              -- start aluminum-magnesium hydroxide + simethicone 30 mL every 4 hours as needed for heartburn  -- As needed agitation protocol in-place  The risks/benefits/side-effects/alternatives to the above medication were discussed in detail with the patient and time was given for questions. The patient consents to medication trial. FDA black box warnings, if present, were discussed.  The patient is agreeable with the medication plan, as above. We will monitor the patient's response to pharmacologic treatment, and adjust medications as necessary.  3.  Routine and other pertinent labs: EKG monitoring: QTc: 425 (10/26/2023)  Metabolism / endocrine: BMI: Body mass index is 19.67 kg/m. Prolactin: No results found for: "PROLACTIN" Lipid Panel: Lab Results  Component Value Date   CHOL  01/21/2009    86        ATP III CLASSIFICATION:  <200     mg/dL   Desirable  409-811  mg/dL   Borderline High  >=914    mg/dL   High          TRIG 58 01/21/2009   HDL 13 (L) 01/21/2009   CHOLHDL 6.6 01/21/2009   VLDL 12 01/21/2009   LDLCALC  01/21/2009    61        Total Cholesterol/HDL:CHD Risk Coronary Heart Disease Risk Table                     Men   Women  1/2 Average Risk   3.4   3.3  Average Risk       5.0   4.4  2 X Average Risk   9.6   7.1  3 X Average Risk  23.4   11.0        Use the calculated Patient Ratio above and the CHD Risk Table to determine the patient's CHD Risk.        ATP III CLASSIFICATION (LDL):  <100     mg/dL   Optimal  782-956  mg/dL   Near or Above                    Optimal  130-159  mg/dL   Borderline  213-086  mg/dL   High  >578     mg/dL   Very High   IONG2X: Hgb A1c MFr Bld (%)  Date Value  01/21/2009    5.4 (NOTE) The ADA recommends the following therapeutic goal for glycemic control related to Hgb A1c measurement: Goal of therapy: <6.5 Hgb A1c  Reference: American Diabetes Association: Clinical Practice Recommendations 2010, Diabetes Care, 2010, 33: (Suppl  1).   TSH: 01/21/2009: 0.789  Drugs of Abuse     Component Value Date/Time   LABOPIA NONE DETECTED 10/25/2023 2104   COCAINSCRNUR POSITIVE (A) 10/25/2023 2104   COCAINSCRNUR NEGATIVE 01/21/2009 1036   LABBENZ NONE DETECTED 10/25/2023 2104   LABBENZ NEGATIVE 01/21/2009 1036   AMPHETMU NONE DETECTED 10/25/2023 2104   THCU POSITIVE (A) 10/25/2023 2104   LABBARB NONE DETECTED 10/25/2023 2104     4. Group Therapy:  -- Encouraged patient to participate in unit milieu and in scheduled group therapies   -- Short Term Goals: Ability to  identify changes in lifestyle to reduce recurrence of condition, verbalize feelings, identify and develop effective coping behaviors, maintain clinical measurements within normal limits, and identify triggers associated with substance abuse/mental health issues will improve. Improvement  in ability to disclose and discuss suicidal ideas, demonstrate self-control, and comply with prescribed medications.  -- Long Term Goals: Improvement in symptoms so as ready for discharge -- Patient is encouraged to participate in group therapy while admitted to the psychiatric unit. -- We will address other chronic and acute stressors, which contributed to the patient's Substance induced mood disorder (HCC) in order to reduce the risk of self-harm at discharge.  5. Discharge Planning:   -- Social work and case management to assist with discharge planning and identification of hospital follow-up needs prior to discharge  -- Estimated LOS: 7 days  -- Discharge Concerns: Need to establish a safety plan; Medication compliance and effectiveness  -- Discharge Goals: Return home with outpatient referrals for mental health follow-up including medication management/psychotherapy  I certify that inpatient services furnished can reasonably be expected to improve the patient's condition.   Signed: Prudencio Browner Cecillia Cogan, Medical Student 10/27/2023, 11:37 AM  I agree with the excellent noted above written by Mara Seminole MS3 and have made minor changes where appropriate.  Signed: Vicente Graham, MD [facility] Psychiatric Residency PGY 1

## 2023-10-27 NOTE — Group Note (Signed)
 LCSW Group Therapy Note   Group Date: 10/27/2023 Start Time: 1100 End Time: 1200   Participation:  did not attend  Type of Therapy:  Group Therapy  Topic:  Finding Balance: Using Wise Mind for Thoughtful Decisions  Objective:  To help participants understand and apply the concept of Agustina Aldrich Mind to make balanced, thoughtful decisions by integrating emotion and logic.  Goals: Learn the differences between Emotional Mind, Reasonable Mind, and [employer]. Recognize personal signs of Emotional and Reasonable Mind. Practice using [employer] in real-life scenarios.  Therapeutic Modalities: Elements of Dialectical Behavior Therapy (DBT):  Mindfulness (noticing thoughts and emotions without judgment), Emotion Regulation (understanding and managing emotional responses), Distress Tolerance (coping with difficult situations without making them worse), Wise Mind (integrating emotion and reason for balanced decision-making) Elements of Cognitive Behavioral Therapy (CBT):  Identifying automatic thoughts, Challenging cognitive distortions, Using logic to reframe unhelpful thinking patterns  Summary:  This class focused on Wise Mind - DBT's concept of balancing Emotional Mind and Reasonable Mind. We identified when we're in each state and practiced using Wise Mind to respond thoughtfully in real-life situations. By combining emotion and logic, participants can improve decision-making, manage challenges, and enhance relationships.   Gary Guerrero Gary Guerrero, LCSWA 10/27/2023  6:18 PM
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
## 2023-10-27 NOTE — BH Assessment (Signed)
 Pt attended group last evening. Denies SI/HI, and AVH. Reports anxiety and depression low. Pt had a shower and cloths were washed. Pt seems to be getting along with roommate as evidenced by they were still talking to each other after midnight. CIWA scores low. Pt denies any withdrawal symptoms. 15 min checks maintained.

## 2023-10-27 NOTE — Plan of Care (Signed)
   Problem: Education: Goal: Knowledge of [facility] Education information/materials will improve Outcome: Progressing Goal: Emotional status will improve Outcome: Progressing Goal: Mental status will improve Outcome: Progressing Goal: Verbalization of understanding the information provided will improve Outcome: Progressing

## 2023-10-27 NOTE — Progress Notes (Signed)
   10/27/23 2300  Psych Admission Type (Psych Patients Only)  Admission Status Voluntary  Psychosocial Assessment  Patient Complaints Anxiety  Eye Contact Brief  Facial Expression Animated;Anxious  Affect Apprehensive  Speech Logical/coherent  Interaction Isolative  Motor Activity Other (Comment) (appriopriate)  Appearance/Hygiene Unremarkable  Behavior Characteristics Cooperative  Mood Preoccupied;Anxious  Thought Process  Coherency WDL  Content WDL  Delusions None reported or observed  Perception WDL  Hallucination None reported or observed  Judgment Impaired  Confusion None  Danger to Self  Current suicidal ideation? Denies  Agreement Not to Harm Self Yes  Description of Agreement verbal  Danger to Others  Danger to Others None reported or observed

## 2023-10-27 NOTE — Group Note (Signed)
 Date:  10/27/2023 Time:  9:35 PM  Group Topic/Focus:  Wrap-Up Group:   The focus of this group is to help patients review their daily goal of treatment and discuss progress on daily workbooks.    Participation Level:  None  Participation Quality:  Inattentive  Affect:  Blunted and Irritable  Cognitive:  Alert  Insight: None  Engagement in Group:  None  Modes of Intervention:  Discussion and Education  Additional Comments:   Pt attended wrap up group this evening, but opted out of sharing their day. Pt has no complaints at this time.   Gary Guerrero 10/27/2023, 9:35 PM

## 2023-10-27 NOTE — BHH Suicide Risk Assessment (Signed)
 BHH INPATIENT:  Family/Significant Other Suicide Prevention Education  Suicide Prevention Education:  Patient Refusal for Family/Significant Other Suicide Prevention Education: The patient Gary Guerrero has refused to provide written consent for family/significant other to be provided Family/Significant Other Suicide Prevention Education during admission and/or prior to discharge.  Physician notified.  [name] 10/27/2023, 2:45 PM

## 2023-10-27 NOTE — BHH Suicide Risk Assessment (Signed)
 [facility] Admission Suicide Risk Assessment   Nursing information obtained from:  Patient Demographic factors:  Male, Low socioeconomic status, Unemployed Current Mental Status:  Suicidal ideation indicated by patient Loss Factors:  NA Historical Factors:  Prior suicide attempts Risk Reduction Factors:  Living with another person, especially a relative  Total Time spent with patient: 45 minutes Principal Problem: Substance induced mood disorder (HCC) Diagnosis:  Principal Problem:   Substance induced mood disorder (HCC)   Subjective Data:   Gary Guerrero is a 63 y.o., male with substance use history of EtOH, Cocaine, and Cannabis and past psychiatric hx of substance induced mood disorder who presents to the [facility] Voluntary from [facility] for evaluation and management of SI, depressive mood with psychotic features. UDS positive for Cocaine and THC.   Continued Clinical Symptoms:  Alcohol Use Disorder Identification Test Final Score (AUDIT): 3 The "Alcohol Use Disorders Identification Test", Guidelines for Use in Primary Care, Second Edition.  World Science writer [facility]). Score between 0-7:  no or low risk or alcohol related problems. Score between 8-15:  moderate risk of alcohol related problems. Score between 16-19:  high risk of alcohol related problems. Score 20 or above:  warrants further diagnostic evaluation for alcohol dependence and treatment.   CLINICAL FACTORS:   Alcohol/Substance Abuse/Dependencies Schizophrenia:   Command hallucinatons Personality Disorders:   Cluster B Comorbid alcohol abuse/dependence Comorbid depression More than one psychiatric diagnosis Unstable or Poor Therapeutic Relationship Previous Psychiatric Diagnoses and Treatments   Psychiatric Specialty Exam: General Appearance: Casual   Eye Contact: Fair  Speech: Clear and Coherent  Volume: Normal  Mood: Depressed  Affect: Constricted   Thought  Content: Logical  Suicidal Thoughts: Denies current SI. Did have SI w/o plan yesterday   Homicidal Thoughts: Yes, for certain people with no plan  Thought Process: Coherent. Slightly tangential  Orientation: Full     Memory: WDL   Judgment: Intact  Insight: Fair  Concentration: Fair  Recall: Fair  Fund of Knowledge: N/A  Language: Fair  Psychomotor Activity: Normal  Assets: Communication Skills, Resilience  Sleep: Fair    Review of Systems Review of Systems  Constitutional:  Negative for chills and fever.  HENT: Negative.    Eyes:        Was covering his eyes in the room   Respiratory: Negative.    Cardiovascular: Negative.   Genitourinary: Negative.   Skin: Negative.   Psychiatric/Behavioral:  Positive for depression and substance abuse. Negative for suicidal ideas.       Vital signs: Blood pressure (!) 127/91, pulse 86, temperature 98.2 F (36.8 C), temperature source Oral, resp. rate 18, height 6' (1.829 m), weight 65.8 kg, SpO2 99%. Body mass index is 19.67 kg/m. Physical Exam HENT:     Head: Normocephalic.  Pulmonary:     Effort: Pulmonary effort is normal.  Neurological:     General: No focal deficit present.     Mental Status: He is alert.    COGNITIVE FEATURES THAT CONTRIBUTE TO RISK:  Closed-mindedness, Loss of executive function, Polarized thinking, and Thought constriction (tunnel vision)    SUICIDE RISK:  Moderate to severe: Recurrent suicidal ideation with limited intensity, and duration, some specificity in terms of plans, no associated intent, moderate self-control, limited dysphoria/symptomatology, some risk factors present, and no identifiable protective factors.  PLAN OF CARE: See H&P for assessment and plan.   I certify that inpatient services furnished can reasonably be expected to improve the patient's condition.  Anesia Blackwell, MD 10/27/2023, 6:48 AM

## 2023-10-27 NOTE — Progress Notes (Signed)
   10/27/23 1300  Psych Admission Type (Psych Patients Only)  Admission Status Voluntary  Psychosocial Assessment  Patient Complaints Isolation  Eye Contact Brief  Facial Expression Animated;Pensive  Affect Constricted  Speech Logical/coherent (Introspective)  Interaction Assertive;Guarded;Other (Comment) (Blunt/to the point)  Appearance/Hygiene Unremarkable  Behavior Characteristics Cooperative  Mood Preoccupied;Depressed  Thought Process  Coherency WDL  Delusions None reported or observed  Perception WDL  Hallucination None reported or observed  Judgment Impaired  Confusion WDL  Danger to Self  Current suicidal ideation? Denies  Self-Injurious Behavior No self-injurious ideation or behavior indicators observed or expressed   Agreement Not to Harm Self Yes  Description of Agreement Verbal  Danger to Others  Danger to Others None reported or observed

## 2023-10-27 NOTE — Group Note (Signed)
 Date:  10/27/2023 Time:  9:16 AM  Group Topic/Focus:  Goals Group:   The focus of this group is to help patients establish daily goals to achieve during treatment and discuss how the patient can incorporate goal setting into their daily lives to aide in recovery.    Participation Level:  Active  Participation Quality:  Appropriate  Affect:  Appropriate   Gary Guerrero 10/27/2023, 9:16 AM

## 2023-10-27 NOTE — Plan of Care (Signed)
   Problem: Education: Goal: Knowledge of [facility] Education information/materials will improve Outcome: Progressing Goal: Emotional status will improve Outcome: Progressing Goal: Mental status will improve Outcome: Progressing

## 2023-10-28 ENCOUNTER — Encounter (HOSPITAL_COMMUNITY): Payer: Self-pay

## 2023-10-28 DIAGNOSIS — F1994 Other psychoactive substance use, unspecified with psychoactive substance-induced mood disorder: Secondary | ICD-10-CM | POA: Diagnosis not present

## 2023-10-28 LAB — RPR: RPR Ser Ql: NONREACTIVE

## 2023-10-28 NOTE — Progress Notes (Signed)
  Gary Guerrero   Type of Note: Housing Authority  Patient was given resources for [employer], low income housing, senior citizen housing, [facility] and voucher programs in [place].   Signed:  Taylin Mans, LCSW-A 10/28/2023  9:52 AM

## 2023-10-28 NOTE — Group Note (Signed)
 Date:  10/28/2023 Time:  8:51 PM  Group Topic/Focus:  Wrap-Up Group:   The focus of this group is to help patients review their daily goal of treatment and discuss progress on daily workbooks.    Additional Comments:  Pt attended the AA meeting this evening.   Gary Guerrero 10/28/2023, 8:51 PM

## 2023-10-28 NOTE — Group Note (Signed)
 Date:  10/28/2023 Time:  9:49 PM  Group Topic/Focus:  Wrap-Up Group:   The focus of this group is to help patients review their daily goal of treatment and discuss progress on daily workbooks.    Participation Level:  Active  Participation Quality:  Appropriate  Affect:  Flat  Cognitive:  Appropriate  Insight: Appropriate  Engagement in Group:  Limited  Modes of Intervention:  Discussion  Additional Comments:  Pt had an ok day  Dellia Ferguson 10/28/2023, 9:49 PM

## 2023-10-28 NOTE — Progress Notes (Addendum)
  Gary Guerrero   Type of Note: Substance use treatment   [facility] - referral faxed 10/27/23  [facility] - referral faxed 10/27/23 - patient given phone number this morning to call and complete phone screening this afternoon. Pt agreeable.  1020AM: Called and spoke with admissions, pt has to have Tailored Plan medicaid vs. Standard plan; patient denied for tx.   Signed:  Kreg Earhart, LCSW-A 10/28/2023  9:53 AM

## 2023-10-28 NOTE — Progress Notes (Signed)
   10/28/23 0610  15 Minute Checks  Location Bedroom  Visual Appearance Calm  Behavior Sleeping  Sleep (Behavioral Health Patients Only)  Calculate sleep? (Click Yes once per 24 hr at 0600 safety check) Yes  Documented sleep last 24 hours 8

## 2023-10-28 NOTE — Plan of Care (Signed)
  Problem: Education: Goal: Utilization of techniques to improve thought processes will improve Outcome: Progressing   Patient denies SI, HI, and AVH. Patient reports feeling like he has the tools he needs to remain sober. Patient has been compliant with medications and engaged appropriately with staff and peers. Continue to monitor for safety

## 2023-10-28 NOTE — Group Note (Signed)
 Date:  10/28/2023 Time:  9:11 AM  Group Topic/Focus:  Goals Group:   The focus of this group is to help patients establish daily goals to achieve during treatment and discuss how the patient can incorporate goal setting into their daily lives to aide in recovery.    Participation Level:  Did Not Attend   Gary Guerrero 10/28/2023, 9:11 AM

## 2023-10-28 NOTE — Plan of Care (Signed)
   Problem: Education: Goal: Knowledge of [facility] Education information/materials will improve Outcome: Progressing Goal: Emotional status will improve Outcome: Progressing Goal: Mental status will improve Outcome: Progressing Goal: Verbalization of understanding the information provided will improve Outcome: Progressing   Problem: Activity: Goal: Interest or engagement in activities will improve Outcome: Progressing Goal: Sleeping patterns will improve Outcome: Progressing   Problem: Coping: Goal: Ability to verbalize frustrations and anger appropriately will improve Outcome: Progressing Goal: Ability to demonstrate self-control will improve Outcome: Progressing   Problem: Health Behavior/Discharge Planning: Goal: Identification of resources available to assist in meeting health care needs will improve Outcome: Progressing Goal: Compliance with treatment plan for underlying cause of condition will improve Outcome: Progressing   Problem: Physical Regulation: Goal: Ability to maintain clinical measurements within normal limits will improve Outcome: Progressing   Problem: Safety: Goal: Periods of time without injury will increase Outcome: Progressing   Problem: Education: Goal: Utilization of techniques to improve thought processes will improve Outcome: Progressing Goal: Knowledge of the prescribed therapeutic regimen will improve Outcome: Progressing   Problem: Activity: Goal: Interest or engagement in leisure activities will improve Outcome: Progressing Goal: Imbalance in normal sleep/wake cycle will improve Outcome: Progressing   Problem: Coping: Goal: Coping ability will improve Outcome: Progressing Goal: Will verbalize feelings Outcome: Progressing   Problem: Health Behavior/Discharge Planning: Goal: Ability to make decisions will improve Outcome: Progressing Goal: Compliance with therapeutic regimen will improve Outcome: Progressing    Problem: Role Relationship: Goal: Will demonstrate positive changes in social behaviors and relationships Outcome: Progressing   Problem: Safety: Goal: Ability to disclose and discuss suicidal ideas will improve Outcome: Progressing Goal: Ability to identify and utilize support systems that promote safety will improve Outcome: Progressing   Problem: Self-Concept: Goal: Will verbalize positive feelings about self Outcome: Progressing Goal: Level of anxiety will decrease Outcome: Progressing   Problem: Education: Goal: Ability to state activities that reduce stress will improve Outcome: Progressing   Problem: Coping: Goal: Ability to identify and develop effective coping behavior will improve Outcome: Progressing   Problem: Self-Concept: Goal: Ability to identify factors that promote anxiety will improve Outcome: Progressing Goal: Level of anxiety will decrease Outcome: Progressing Goal: Ability to modify response to factors that promote anxiety will improve Outcome: Progressing   Problem: Education: Goal: Ability to make informed decisions regarding treatment will improve Outcome: Progressing   Problem: Coping: Goal: Coping ability will improve Outcome: Progressing   Problem: Health Behavior/Discharge Planning: Goal: Identification of resources available to assist in meeting health care needs will improve Outcome: Progressing   Problem: Medication: Goal: Compliance with prescribed medication regimen will improve Outcome: Progressing   Problem: Self-Concept: Goal: Ability to disclose and discuss suicidal ideas will improve Outcome: Progressing Goal: Will verbalize positive feelings about self Outcome: Progressing Note: Patient is on track. Patient will maintain adherence
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
## 2023-10-28 NOTE — Progress Notes (Addendum)
  Gary Guerrero   Type of Note: [facility]  Patient was accepted to [facility] and will admit there on Thursday, 6/5 by 9:00AM. MD aware.  Patient aware and agreeable.   Signed:  Cristhian Vanhook, LCSW-A 10/28/2023  2:28 PM

## 2023-10-28 NOTE — Group Note (Signed)
 Recreation Therapy Group Note   Group Topic:Team Building  Group Date: 10/28/2023 Start Time: 0940 End Time: 1005 Facilitators: Olive Zmuda-McCall, LRT,CTRS Location: [address] Day[address]   Group Topic: Communication, Team Building, Problem Solving  Goal Area(s) Addresses:  Patient will effectively work with peer towards shared goal.  Patient will identify skills used to make activity successful.  Patient will identify how skills used during activity can be used to reach post d/c goals.   Behavioral Response:   Intervention: STEM Activity  Activity: Straw Bridge. In teams of 3-5, patients were given 15 plastic drinking straws and an equal length of masking tape. Using the materials provided, patients were instructed to build a free standing bridge-like structure to suspend an everyday item (ex: puzzle box) off of the floor or table surface. All materials were required to be used by the team in their design. LRT facilitated post-activity discussion reviewing team process. Patients were encouraged to reflect how the skills used in this activity can be generalized to daily life post discharge.   Education: Pharmacist, community, Scientist, physiological, Discharge Planning   Education Outcome: Acknowledges education/In group clarification offered/Needs additional education.    Affect/Mood: N/A   Participation Level: Did not attend    Clinical Observations/Individualized Feedback:    Plan: Continue to engage patient in RT group sessions 2-3x/week.   Jenille Laszlo-McCall, LRT,CTRS 10/28/2023 12:40 PM

## 2023-10-28 NOTE — BH IP Treatment Plan (Signed)
 Interdisciplinary Treatment and Diagnostic Plan Update  10/28/2023 Time of Session: 10:45 AM Gary Guerrero MRN: [id number]  Principal Diagnosis: Substance induced mood disorder (HCC)  Secondary Diagnoses: Principal Problem:   Substance induced mood disorder (HCC)   Current Medications:  Current Facility-Administered Medications  Medication Dose Route Frequency Provider Last Rate Last Admin   acetaminophen  (TYLENOL ) tablet 650 mg  650 mg Oral Q6H PRN Volanda Gruber, NP       alum & mag hydroxide-simeth (MAALOX/MYLANTA) 200-200-20 MG/5ML suspension 30 mL  30 mL Oral Q4H PRN Volanda Gruber, NP       haloperidol (HALDOL) tablet 5 mg  5 mg Oral TID PRN Volanda Gruber, NP       And   diphenhydrAMINE (BENADRYL) capsule 50 mg  50 mg Oral TID PRN Volanda Gruber, NP       haloperidol lactate (HALDOL) injection 5 mg  5 mg Intramuscular TID PRN Volanda Gruber, NP       And   diphenhydrAMINE (BENADRYL) injection 50 mg  50 mg Intramuscular TID PRN Volanda Gruber, NP       And   LORazepam (ATIVAN) injection 2 mg  2 mg Intramuscular TID PRN Volanda Gruber, NP       haloperidol lactate (HALDOL) injection 10 mg  10 mg Intramuscular TID PRN Volanda Gruber, NP       And   diphenhydrAMINE (BENADRYL) injection 50 mg  50 mg Intramuscular TID PRN Volanda Gruber, NP       And   LORazepam (ATIVAN) injection 2 mg  2 mg Intramuscular TID PRN Volanda Gruber, NP       feeding supplement (ENSURE PLUS HIGH PROTEIN) liquid 237 mL  237 mL Oral BID BM Attiah, Nadir, MD   237 mL at 10/28/23 1442   hydrOXYzine (ATARAX) tablet 25 mg  25 mg Oral Q6H PRN Volanda Gruber, NP       loperamide (IMODIUM) capsule 2-4 mg  2-4 mg Oral PRN Volanda Gruber, NP       LORazepam (ATIVAN) tablet 1 mg  1 mg Oral Q6H PRN Volanda Gruber, NP       magnesium hydroxide (MILK OF MAGNESIA) suspension 30 mL  30 mL Oral Daily PRN Volanda Gruber, NP       multivitamin with minerals tablet 1 tablet  1 tablet Oral Daily  Volanda Gruber, NP   1 tablet at 10/28/23 0747   naltrexone (DEPADE) tablet 50 mg  50 mg Oral Daily Gwyndolyn Lerner, MD   50 mg at 10/27/23 1304   ondansetron (ZOFRAN-ODT) disintegrating tablet 4 mg  4 mg Oral Q6H PRN Volanda Gruber, NP       QUEtiapine (SEROQUEL) tablet 50 mg  50 mg Oral QHS Gwyndolyn Lerner, MD       sertraline (ZOLOFT) tablet 50 mg  50 mg Oral Daily Gwyndolyn Lerner, MD       thiamine (Vitamin B-1) tablet 100 mg  100 mg Oral Daily Volanda Gruber, NP   100 mg at 10/28/23 0747   PTA Medications: No medications prior to admission.    Patient Stressors: Health problems   Substance abuse    Patient Strengths: Capable of independent living  Communication skills   Treatment Modalities: Medication Management, Group therapy, Case management,  1 to 1 session with clinician, Psychoeducation, Recreational therapy.   Physician Treatment Plan for Primary Diagnosis: Substance induced mood disorder (HCC) Long Term Goal(s):  Short Term Goals:    Medication Management: Evaluate patient's response, side effects, and tolerance of medication regimen.  Therapeutic Interventions: 1 to 1 sessions, Unit Group sessions and Medication administration.  Evaluation of Outcomes: Not Progressing  Physician Treatment Plan for Secondary Diagnosis: Principal Problem:   Substance induced mood disorder (HCC)  Long Term Goal(s):     Short Term Goals:       Medication Management: Evaluate patient's response, side effects, and tolerance of medication regimen.  Therapeutic Interventions: 1 to 1 sessions, Unit Group sessions and Medication administration.  Evaluation of Outcomes: Not Progressing   RN Treatment Plan for Primary Diagnosis: Substance induced mood disorder (HCC) Long Term Goal(s): Knowledge of disease and therapeutic regimen to maintain health will improve  Short Term Goals: Ability to remain free from injury will improve, Ability to verbalize frustration and  anger appropriately will improve, Ability to demonstrate self-control, Ability to participate in decision making will improve, Ability to verbalize feelings will improve, Ability to disclose and discuss suicidal ideas, Ability to identify and develop effective coping behaviors will improve, and Compliance with prescribed medications will improve  Medication Management: RN will administer medications as ordered by provider, will assess and evaluate patient's response and provide education to patient for prescribed medication. RN will report any adverse and/or side effects to prescribing provider.  Therapeutic Interventions: 1 on 1 counseling sessions, Psychoeducation, Medication administration, Evaluate responses to treatment, Monitor vital signs and CBGs as ordered, Perform/monitor CIWA, COWS, AIMS and Fall Risk screenings as ordered, Perform wound care treatments as ordered.  Evaluation of Outcomes: Not Progressing   LCSW Treatment Plan for Primary Diagnosis: Substance induced mood disorder (HCC) Long Term Goal(s): Safe transition to appropriate next level of care at discharge, Engage patient in therapeutic group addressing interpersonal concerns.  Short Term Goals: Engage patient in aftercare planning with referrals and resources, Increase social support, Increase ability to appropriately verbalize feelings, Increase emotional regulation, Facilitate acceptance of mental health diagnosis and concerns, Facilitate patient progression through stages of change regarding substance use diagnoses and concerns, and Identify triggers associated with mental health/substance abuse issues  Therapeutic Interventions: Assess for all discharge needs, 1 to 1 time with Social worker, Explore available resources and support systems, Assess for adequacy in community support network, Educate family and significant other(s) on suicide prevention, Complete Psychosocial Assessment, Interpersonal group therapy.  Evaluation  of Outcomes: Not Progressing   Progress in Treatment: Attending groups: attended some groups Participating in groups: Yes. Taking medication as prescribed: Yes. Toleration medication: Yes. Family/Significant other contact made:patient declined consents Patient understands diagnosis: Yes. Discussing patient identified problems/goals with staff: Yes. Medical problems stabilized or resolved: Yes. Denies suicidal/homicidal ideation: Yes. Issues/concerns per patient self-inventory: No.  New problem(s) identified: No  New Short Term/Long Term Goal(s):    medication stabilization, elimination of SI thoughts, development of comprehensive mental wellness plan.   Patient Goals:  "I want to attend a 30-day treatment program."   Discharge Plan or Barriers:  Patient recently admitted. CSW will continue to follow and assess for appropriate referrals and possible discharge planning.   Reason for Continuation of Hospitalization: Medication stabilization Suicidal ideation  Estimated Length of Stay:  5 - 7 days  Last 3 [place] Suicide Severity Risk Score: Flowsheet Row [facility] (Current) [facility] from 10[facility]25/2023 in [facility] Department [facility]  C-SSRS RISK CATEGORY High Risk High Risk       Last [facility] 2/9 Scores:  No data to display          Scribe for Treatment Team: Ladon Heney O Meelah Tallo, LCSWA 10/28/2023 6:16 PM

## 2023-10-29 DIAGNOSIS — F1994 Other psychoactive substance use, unspecified with psychoactive substance-induced mood disorder: Secondary | ICD-10-CM | POA: Diagnosis not present

## 2023-10-29 NOTE — [facility] Group Notes (Signed)
 [facility] Group Notes:  (Nursing/MHT/Case Management/Adjunct)  Date:  10/29/2023  Time: 2000  Type of Therapy:  Wrap up group  Participation Level:  Active  Participation Quality:  Attentive and Sharing  Affect:  Depressed, Irritable, and Resistant  Cognitive:  Alert  Insight:  Improving  Engagement in Group:  Developing/Improving  Modes of Intervention:  Clarification, Education, and Socialization  Summary of Progress/Problems: Positive thinking and self-care were discussed.   Gary Guerrero 10/29/2023, 9:48 PM
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
## 2023-10-29 NOTE — Group Note (Signed)
 LCSW Group Therapy Note   Date/Time:  @TD @ 11:30am-12:00pm  Type of Therapy and Topic:  Group Therapy:  commitment  Participation Level:  None   Description of Group This process group involved a discussion with and between patients about commitment.   That is, much change occurs by making a plan to do something and then doing it, no matter how one feels at the time. , then examples were elicited from group members for the payoff is that self-esteem builds with accomplishment.  This then was followed by a discussion about how the patient are committed to their self, what it is, how important it is, and why we choose the coping techniques we choose.  Participants were encouraged to think about commitment as necessary and positive.  Therapeutic Goals Patient will identify and describe one commitment Patient will participate in generating ideas about being committed to themselves and how it feels when you accomplishment the commitment What is the patient self-esteem level and how they feel about themself The patient are encourage to commit on a work sheet listing their promise and how it will get accomplish  Summary of Patient Progress:  The patient came in late 50 minutes late and share that when he is committed to something he always keep his words.   Therapeutic Modalities Brief Solution-Focused Therapy Psychoeducation  Gary Guerrero Gary Guerrero, LCSWA 10/29/2023  4:53 PM

## 2023-10-29 NOTE — Progress Notes (Signed)
   10/29/23 2100  Psych Admission Type (Psych Patients Only)  Admission Status Voluntary  Psychosocial Assessment  Patient Complaints None  Eye Contact Fair  Facial Expression Animated  Affect Appropriate to circumstance  Speech Logical/coherent  Interaction Assertive  Motor Activity Other (Comment) (wdl)  Appearance/Hygiene Unremarkable  Behavior Characteristics Cooperative  Mood Pleasant  Thought Process  Coherency WDL  Content WDL  Delusions None reported or observed  Perception WDL  Hallucination None reported or observed  Judgment WDL  Confusion None  Danger to Self  Current suicidal ideation? Denies

## 2023-10-29 NOTE — [facility] Group Notes (Signed)
 [facility] Group Notes:  (Nursing/MHT/Case Management/Adjunct)  Date:  10/29/2023  Time:  2000  Type of Therapy:  Wrap up group  Participation Level:  {[facility] PARTICIPATION ZOXWR:60454}  Participation Quality:  {[facility] PARTICIPATION QUALITY:22265}  Affect:  {[facility] AFFECT:22266}  Cognitive:  {[facility] COGNITIVE:22267}  Insight:  {B[facility] Insight2:20797}  Engagement in Group:  {[facility] ENGAGEMENT IN UJWJX:91478}  Modes of Intervention:  {[facility] MODES OF INTERVENTION:22269}  Summary of Progress/Problems:  Gary Guerrero 10/29/2023, 9:33 PM

## 2023-10-29 NOTE — Plan of Care (Signed)
   Problem: Education: Goal: Knowledge of [facility] Education information/materials will improve Outcome: Progressing Goal: Emotional status will improve Outcome: Progressing Goal: Mental status will improve Outcome: Progressing Goal: Verbalization of understanding the information provided will improve Outcome: Progressing

## 2023-10-29 NOTE — Progress Notes (Signed)
   10/29/23 1200  Psych Admission Type (Psych Patients Only)  Admission Status Voluntary  Psychosocial Assessment  Patient Complaints None  Eye Contact Fair  Facial Expression Animated  Affect Appropriate to circumstance  Speech Logical/coherent  Interaction Assertive  Motor Activity Slow  Appearance/Hygiene Unremarkable  Behavior Characteristics Cooperative  Mood Pleasant  Thought Process  Coherency WDL  Content WDL  Delusions None reported or observed  Perception WDL  Hallucination None reported or observed  Judgment WDL  Confusion None  Danger to Self  Current suicidal ideation? Denies  Agreement Not to Harm Self Yes  Description of Agreement Verbal  Danger to Others  Danger to Others None reported or observed

## 2023-10-29 NOTE — Group Note (Signed)
 Date:  10/29/2023 Time:  9:15 AM  Group Topic/Focus:  Goals Group:   The focus of this group is to help patients establish daily goals to achieve during treatment and discuss how the patient can incorporate goal setting into their daily lives to aide in recovery.    Participation Level:  Did Not Attend    Vallarie Gauze 10/29/2023, 9:15 AM

## 2023-10-29 NOTE — Progress Notes (Signed)
 Pt  with limited but appropriate visibility in the milieu.  Interacting appropriately with staff and peers.  Attended group.  Pt was unable to rate depression stating it fluctuates up and down.  Denied SI, HI and AVH.  Pt stated he will be going to a 30 day program after d/c for which he is happy.  Pt stated he recently relapsed on ETOH use after having been sober for 3 years.  Pt stated he quickly sought out assistance as he has no desire to continue using.  Supportive listening provided.  Pt was receptive.  Safety checks continue for patient safety.  Pt safe on unit.

## 2023-10-30 DIAGNOSIS — F1994 Other psychoactive substance use, unspecified with psychoactive substance-induced mood disorder: Secondary | ICD-10-CM | POA: Diagnosis not present

## 2023-10-30 MED ORDER — QUETIAPINE FUMARATE 100 MG PO TABS
100.0000 mg | ORAL_TABLET | Freq: Every day | ORAL | Status: DC
  Filled 2023-10-30: qty 1
  Filled 2023-10-30: qty 14
  Filled 2023-10-30: qty 1

## 2023-10-30 MED ORDER — AMLODIPINE BESYLATE 10 MG PO TABS
10.0000 mg | ORAL_TABLET | Freq: Every day | ORAL | Status: DC
  Filled 2023-10-30: qty 14
  Filled 2023-10-30: qty 1

## 2023-10-30 NOTE — Plan of Care (Signed)
   Problem: Education: Goal: Knowledge of [facility] Education information/materials will improve Outcome: Progressing Goal: Emotional status will improve Outcome: Progressing Goal: Mental status will improve Outcome: Progressing

## 2023-10-30 NOTE — Progress Notes (Addendum)
 D:  Patient refused to take his norvasc 10 mg, zoloft  50 mg, this morning. Patient did  take multivitamin, depade, vitamin B-1. When he got out of bed before lunch. Patient went to breakfast this morning and ate lunch in the  dining room. Patient denied SI and HI, contracts for safety.  Denied A/V hallucinations. A:   Emotional support and encouragement given patient. R: Safety maintained with 15 minute checks.

## 2023-10-30 NOTE — [facility] Group Notes (Signed)
 Psychoeducational Group Note  Date:  10/30/2023 Time:  2000  Group Topic/Focus:  Wrap up group  Participation Level: Did Not Attend  Participation Quality:  Not Applicable  Affect:  Not Applicable  Cognitive:  Not Applicable  Insight:  Not Applicable  Engagement in Group: Not Applicable  Additional Comments:  Did not attend.   Catharine Clock 10/30/2023, 9:37 PM

## 2023-10-30 NOTE — Group Note (Signed)
 Date:  10/30/2023 Time:  5:52 PM  Group Topic/Focus:  Rediscovering Joy:   The focus of this group is to explore various ways to relieve stress in a positive manner. (Patients created a 'life collage')   Cultivating creativity can help synthesize knowledge, experiences, frustrations, and support our mental health   Participation Level:  Did Not Attend    Victorine Grater 10/30/2023, 5:52 PM
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
## 2023-10-30 NOTE — Group Note (Signed)
 Date:  10/30/2023 Time:  5:24 PM  Group Topic/Focus:   Setbacks in Recovery:   The focus of this group is to identify unhealthy thought patterns and develop a plan to handle them in a healthier way upon discharge.    Participation Level:  Did Not Attend   Gary Guerrero 10/30/2023, 5:24 PM

## 2023-10-30 NOTE — Group Note (Signed)
 Date:  10/30/2023 Time:  5:17 PM  Group Topic/Focus:  Goals Group:   The focus of this group is to help patients establish daily goals to achieve during treatment and discuss how the patient can incorporate goal setting into their daily lives to aide in recovery. Orientation:   The focus of this group is to educate the patient on the purpose and policies of crisis stabilization and provide a format to answer questions about their admission.  The group details unit policies and expectations of patients while admitted.    Participation Level:  Did Not Attend   Sheryl Donna 10/30/2023, 5:17 PM

## 2023-10-30 NOTE — Plan of Care (Signed)
   Problem: Education: Goal: Mental status will improve Outcome: Progressing   Problem: Activity: Goal: Interest or engagement in activities will improve Outcome: Progressing

## 2023-10-30 NOTE — Plan of Care (Signed)
 Nurse discussed anxiety, depression and coping skills with patient.

## 2023-10-31 DIAGNOSIS — F1994 Other psychoactive substance use, unspecified with psychoactive substance-induced mood disorder: Secondary | ICD-10-CM | POA: Diagnosis not present

## 2023-10-31 NOTE — BH Assessment (Signed)
 Patient was in hallway at beginning of shift. He did not attend group. Pt refused night meds. Denies SI/HI, and AVH. Pt did say he missed his room mate because he went home today. Pt likes to sit and look out his window in room. 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

## 2023-10-31 NOTE — Plan of Care (Signed)
   Problem: Education: Goal: Knowledge of [facility] Education information/materials will improve Outcome: Progressing Goal: Emotional status will improve Outcome: Progressing Goal: Mental status will improve Outcome: Progressing Goal: Verbalization of understanding the information provided will improve Outcome: Progressing

## 2023-10-31 NOTE — Group Note (Signed)
 Recreation Therapy Group Note   Group Topic:Stress Management  Group Date: 10/31/2023 Start Time: 0935 End Time: 0955 Facilitators: Efosa Treichler-McCall, LRT,CTRS Location: 300 [address] Dayroom   Group Topic: Stress Management  Goal Area(s) Addresses:  Patient will identify positive stress management techniques. Patient will identify benefits of using stress management post d/c.  Behavioral Response:   Intervention: Insight Timer App  Activity: Meditation. LRT played a meditation that focused encouraged patients to envision their purpose for the day. Patients were to sit, focus and visualize how they wanted their purpose to guide their day.    Education:  Stress Management, Discharge Planning.   Education Outcome: Acknowledges Education   Affect/Mood: N/A   Participation Level: Did not attend    Clinical Observations/Individualized Feedback:     Plan: Continue to engage patient in RT group sessions 2-3x/week.   Gary Guerrero, LRT,CTRS 10/31/2023 12:27 PM
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
## 2023-10-31 NOTE — [facility] Group Notes (Signed)
 [facility] Group Notes:  (Nursing/MHT/Case Management/Adjunct)  Date:  10/31/2023  Time:  10:54 PM  Type of Therapy:  Psychoeducational Skills  Participation Level:  Active  Participation Quality:  Appropriate  Affect:  Flat  Cognitive:  Appropriate  Insight:  Appropriate  Engagement in Group:  Engaged  Modes of Intervention:  Education  Summary of Progress/Problems: The patient attended the evening A.A. speaker's meeting and was appropriate. He shared briefly with the group and encouraged his younger peers.   Ashaki Frosch S 10/31/2023, 10:54 PM

## 2023-10-31 NOTE — Group Note (Signed)
 Date:  10/31/2023 Time:  8:33 AM  Group Topic/Focus:  Goals Group:   The focus of this group is to help patients establish daily goals to achieve during treatment and discuss how the patient can incorporate goal setting into their daily lives to aide in recovery.    Participation Level:  Did Not Attend  P Ellan Gunner 10/31/2023, 8:33 AM

## 2023-10-31 NOTE — Plan of Care (Signed)
  Problem: Coping: Goal: Ability to demonstrate self-control will improve Outcome: Progressing   Problem: Safety: Goal: Periods of time without injury will increase Outcome: Progressing   Problem: Safety: Goal: Ability to disclose and discuss suicidal ideas will improve Outcome: Progressing

## 2023-11-01 DIAGNOSIS — F1994 Other psychoactive substance use, unspecified with psychoactive substance-induced mood disorder: Secondary | ICD-10-CM | POA: Diagnosis not present

## 2023-11-01 MED ORDER — NAPHAZOLINE-PHENIRAMINE 0.025-0.3 % OP SOLN: 2.0000 [drp] | Freq: Four times a day (QID) | OPHTHALMIC | Status: DC | PRN

## 2023-11-01 NOTE — [facility] Group Notes (Signed)
 Spiritual care group on grief and loss facilitated by Chaplain Nick Barman, [facility]  Group Goal: Support / Education around grief and loss  Members engage in facilitated group support and psycho-social education.  Group Description:  Following introductions and group rules, group members engaged in facilitated group dialogue and support around topic of loss, with particular support around experiences of loss in their lives. Group Identified types of loss (relationships / self / things) and identified patterns, circumstances, and changes that precipitate losses. Reflected on thoughts / feelings around loss, normalized grief responses, and recognized variety in grief experience. Group encouraged individual reflection on safe space and on the coping skills that they are already utilizing.  Group drew on Adlerian / Rogerian and narrative framework  Patient Progress: Gary Guerrero attended group and actively engaged and participated in group conversation and activities.  Comments demonstrated good insight and contributed positively to the group conversation.

## 2023-11-01 NOTE — Progress Notes (Signed)
   11/01/23 0810  Psych Admission Type (Psych Patients Only)  Admission Status Voluntary  Psychosocial Assessment  Patient Complaints Irritability  Eye Contact Fair  Facial Expression Animated  Affect Appropriate to circumstance  Speech Logical/coherent  Interaction Defensive  Motor Activity Fidgety  Appearance/Hygiene Unremarkable  Behavior Characteristics Cooperative  Mood Anxious  Thought Process  Coherency Circumstantial  Content Blaming self  Delusions None reported or observed  Perception WDL  Hallucination None reported or observed  Judgment WDL  Confusion None  Danger to Self  Current suicidal ideation? Denies  Agreement Not to Harm Self Yes  Description of Agreement verbal  Danger to Others  Danger to Others None reported or observed

## 2023-11-01 NOTE — Progress Notes (Signed)
   11/01/23 2200  Psych Admission Type (Psych Patients Only)  Admission Status Voluntary  Psychosocial Assessment  Patient Complaints None  Eye Contact Fair  Facial Expression Animated  Affect Appropriate to circumstance  Speech Logical/coherent  Interaction Isolative  Motor Activity Fidgety  Appearance/Hygiene Unremarkable  Behavior Characteristics Cooperative  Mood Anxious;Pleasant  Thought Process  Coherency Circumstantial  Content Preoccupation (on discharge)  Delusions None reported or observed  Perception WDL  Hallucination None reported or observed  Judgment WDL  Confusion None  Danger to Self  Current suicidal ideation? Denies  Agreement Not to Harm Self Yes  Description of Agreement verbal  Danger to Others  Danger to Others None reported or observed

## 2023-11-01 NOTE — Progress Notes (Signed)
 Gary Guerrero did not attend wrap up group

## 2023-11-01 NOTE — Progress Notes (Signed)
   10/31/23 2000  Psych Admission Type (Psych Patients Only)  Admission Status Voluntary  Psychosocial Assessment  Patient Complaints Irritability  Eye Contact Fair  Facial Expression Blank  Affect Appropriate to circumstance  Speech Logical/coherent  Interaction Defensive  Motor Activity Fidgety;Restless  Appearance/Hygiene Unremarkable  Behavior Characteristics Cooperative  Mood Anxious;Labile  Aggressive Behavior  Effect No apparent injury  Thought Process  Coherency Circumstantial  Content Blaming self  Delusions None reported or observed  Perception WDL  Hallucination None reported or observed  Judgment WDL  Confusion None  Danger to Self  Current suicidal ideation? Denies (Denies)  Agreement Not to Harm Self Yes  Description of Agreement verbal  Danger to Others  Danger to Others None reported or observed   Patient denied HS Seroquel  stating that he is here to go  to a 30 day program he does not take any medications. Support and encouragement provided.

## 2023-11-01 NOTE — Group Note (Signed)
 LCSW Group Therapy Note   Group Date: 11/01/2023 Start Time: 1100 End Time: 1200  Participation: did not attend  Type of Therapy:  Group Therapy   Topic: Lifestyle:  from "One Day" to "Today is Day One"  Objective:  To promote mental and physical well-being through lifestyle changes in routine, nutrition, sleep, and movement.  Goals: Increase awareness of how lifestyle habits impact mental health. Encourage one small, achievable wellness goal. Support group sharing and accountability.  Summary:  Group members explored how daily habits influence mental health and discussed the importance of starting with small, manageable changes. Participants identified personal goals and shared reflections on improving structure, sleep, diet, and physical activity.  Therapeutic Modalities: CBT - Identifying and challenging all-or-nothing thinking; promoting realistic, helpful thoughts about change. Psychoeducation - Teaching about the impact of sleep, nutrition, movement, and routine on mental health. Motivational Interviewing - Eliciting personal motivation and exploring readiness for change. Goal-Setting - Supporting SMART goals to build self-efficacy and encourage follow-through.   Jayne Peckenpaugh O Peggie Hornak, LCSWA 11/01/2023  12:33 PM

## 2023-11-01 NOTE — Group Note (Signed)
 Date:  11/01/2023 Time:  9:04 AM  Group Topic/Focus:  Goals Group:   The focus of this group is to help patients establish daily goals to achieve during treatment and discuss how the patient can incorporate goal setting into their daily lives to aide in recovery. Orientation:   The focus of this group is to educate the patient on the purpose and policies of crisis stabilization and provide a format to answer questions about their admission.  The group details unit policies and expectations of patients while admitted.    Participation Level:  Did Not Attend

## 2023-11-01 NOTE — Plan of Care (Signed)
  Problem: Education: Goal: Knowledge of [facility] Education information/materials will improve Outcome: Completed/Met Goal: Verbalization of understanding the information provided will improve Outcome: Progressing   Problem: Activity: Goal: Interest or engagement in activities will improve Outcome: Progressing   Problem: Coping: Goal: Ability to demonstrate self-control will improve Outcome: Adequate for Discharge   Problem: Health Behavior/Discharge Planning: Goal: Compliance with treatment plan for underlying cause of condition will improve Outcome: Progressing
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
## 2023-11-02 ENCOUNTER — Encounter (HOSPITAL_COMMUNITY): Payer: Self-pay

## 2023-11-02 DIAGNOSIS — F1994 Other psychoactive substance use, unspecified with psychoactive substance-induced mood disorder: Secondary | ICD-10-CM | POA: Diagnosis not present

## 2023-11-02 MED ORDER — QUETIAPINE FUMARATE 100 MG PO TABS: 100.0000 mg | ORAL_TABLET | Freq: Every day | ORAL | 0 refills | Status: AC

## 2023-11-02 MED ORDER — NAPHAZOLINE-PHENIRAMINE 0.025-0.3 % OP SOLN: 2.0000 [drp] | Freq: Four times a day (QID) | OPHTHALMIC | Status: AC | PRN

## 2023-11-02 MED ORDER — AMLODIPINE BESYLATE 10 MG PO TABS: 10.0000 mg | ORAL_TABLET | Freq: Every day | ORAL | 0 refills | Status: AC

## 2023-11-02 MED ORDER — SERTRALINE HCL 50 MG PO TABS: 50.0000 mg | ORAL_TABLET | Freq: Every day | ORAL | 0 refills | Status: AC

## 2023-11-02 MED ORDER — VITAMIN B-1 100 MG PO TABS: 100.0000 mg | ORAL_TABLET | Freq: Every day | ORAL | 0 refills | Status: AC

## 2023-11-02 MED ORDER — NALTREXONE HCL 50 MG PO TABS: 50.0000 mg | ORAL_TABLET | Freq: Every day | ORAL | 0 refills | Status: AC

## 2023-11-02 NOTE — Plan of Care (Signed)
  Problem: Education: Goal: Mental status will improve Outcome: Progressing   Problem: Activity: Goal: Interest or engagement in activities will improve Outcome: Progressing   Problem: Coping: Goal: Ability to verbalize frustrations and anger appropriately will improve Outcome: Progressing   Problem: Health Behavior/Discharge Planning: Goal: Compliance with treatment plan for underlying cause of condition will improve Outcome: Progressing

## 2023-11-02 NOTE — Plan of Care (Signed)
  Problem: Health Behavior/Discharge Planning: Goal: Ability to make decisions will improve Outcome: Progressing   Problem: Health Behavior/Discharge Planning: Goal: Identification of resources available to assist in meeting health care needs will improve Outcome: Progressing

## 2023-11-02 NOTE — BH IP Treatment Plan (Signed)
 Interdisciplinary Treatment and Diagnostic Plan Update  11/02/2023 Time of Session: THIS IS AN UPDATE Gary Guerrero MRN: [id number]  Principal Diagnosis: Substance induced mood disorder (HCC)  Secondary Diagnoses: Principal Problem:   Substance induced mood disorder (HCC)   Current Medications:  Current Facility-Administered Medications  Medication Dose Route Frequency Provider Last Rate Last Admin   acetaminophen  (TYLENOL ) tablet 650 mg  650 mg Oral Q6H PRN Volanda Gruber, NP       alum & mag hydroxide-simeth (MAALOX/MYLANTA) 200-200-20 MG/5ML suspension 30 mL  30 mL Oral Q4H PRN Volanda Gruber, NP       amLODipine (NORVASC) tablet 10 mg  10 mg Oral Daily Parker, Alvin S, MD       haloperidol  (HALDOL ) tablet 5 mg  5 mg Oral TID PRN Volanda Gruber, NP       And   diphenhydrAMINE  (BENADRYL ) capsule 50 mg  50 mg Oral TID PRN Volanda Gruber, NP       haloperidol  lactate (HALDOL ) injection 5 mg  5 mg Intramuscular TID PRN Volanda Gruber, NP       And   diphenhydrAMINE  (BENADRYL ) injection 50 mg  50 mg Intramuscular TID PRN Volanda Gruber, NP       And   LORazepam  (ATIVAN ) injection 2 mg  2 mg Intramuscular TID PRN Volanda Gruber, NP       haloperidol  lactate (HALDOL ) injection 10 mg  10 mg Intramuscular TID PRN Volanda Gruber, NP       And   diphenhydrAMINE  (BENADRYL ) injection 50 mg  50 mg Intramuscular TID PRN Volanda Gruber, NP       And   LORazepam  (ATIVAN ) injection 2 mg  2 mg Intramuscular TID PRN Volanda Gruber, NP       feeding supplement (ENSURE PLUS HIGH PROTEIN) liquid 237 mL  237 mL Oral BID BM Attiah, Nadir, MD   237 mL at 11/02/23 1324   magnesium  hydroxide (MILK OF MAGNESIA) suspension 30 mL  30 mL Oral Daily PRN Volanda Gruber, NP       multivitamin with minerals tablet 1 tablet  1 tablet Oral Daily Volanda Gruber, NP   1 tablet at 11/02/23 4098   naltrexone  (DEPADE) tablet 50 mg  50 mg Oral Daily Gwyndolyn Lerner, MD   50 mg at 11/01/23 0817    naphazoline-pheniramine (NAPHCON-A) 0.025-0.3 % ophthalmic solution 2 drop  2 drop Both Eyes QID PRN Izediuno, Iline Mallory, MD       QUEtiapine  (SEROQUEL ) tablet 100 mg  100 mg Oral QHS Timmothy Foots, MD       sertraline  (ZOLOFT ) tablet 50 mg  50 mg Oral Daily Gwyndolyn Lerner, MD       thiamine  (Vitamin B-1) tablet 100 mg  100 mg Oral Daily Volanda Gruber, NP   100 mg at 11/02/23 1191   PTA Medications: No medications prior to admission.    Patient Stressors: Health problems   Substance abuse    Patient Strengths: Capable of independent living  Communication skills   Treatment Modalities: Medication Management, Group therapy, Case management,  1 to 1 session with clinician, Psychoeducation, Recreational therapy.   Physician Treatment Plan for Primary Diagnosis: Substance induced mood disorder (HCC) Long Term Goal(s):     Short Term Goals:    Medication Management: Evaluate patient's response, side effects, and tolerance of medication regimen.  Therapeutic Interventions: 1 to 1 sessions, Unit Group sessions and Medication administration.  Evaluation  of Outcomes: Progressing  Physician Treatment Plan for Secondary Diagnosis: Principal Problem:   Substance induced mood disorder (HCC)  Long Term Goal(s):     Short Term Goals:       Medication Management: Evaluate patient's response, side effects, and tolerance of medication regimen.  Therapeutic Interventions: 1 to 1 sessions, Unit Group sessions and Medication administration.  Evaluation of Outcomes: Progressing   RN Treatment Plan for Primary Diagnosis: Substance induced mood disorder (HCC) Long Term Goal(s): Knowledge of disease and therapeutic regimen to maintain health will improve  Short Term Goals: Ability to verbalize frustration and anger appropriately will improve and Ability to verbalize feelings will improve  Medication Management: RN will administer medications as ordered by provider, will assess and  evaluate patient's response and provide education to patient for prescribed medication. RN will report any adverse and/or side effects to prescribing provider.  Therapeutic Interventions: 1 on 1 counseling sessions, Psychoeducation, Medication administration, Evaluate responses to treatment, Monitor vital signs and CBGs as ordered, Perform/monitor CIWA, COWS, AIMS and Fall Risk screenings as ordered, Perform wound care treatments as ordered.  Evaluation of Outcomes: Progressing   LCSW Treatment Plan for Primary Diagnosis: Substance induced mood disorder (HCC) Long Term Goal(s): Safe transition to appropriate next level of care at discharge, Engage patient in therapeutic group addressing interpersonal concerns.  Short Term Goals: Engage patient in aftercare planning with referrals and resources, Increase social support, Identify triggers associated with mental health/substance abuse issues, and Increase skills for wellness and recovery  Therapeutic Interventions: Assess for all discharge needs, 1 to 1 time with Social worker, Explore available resources and support systems, Assess for adequacy in community support network, Educate family and significant other(s) on suicide prevention, Complete Psychosocial Assessment, Interpersonal group therapy.  Evaluation of Outcomes: Progressing   Progress in Treatment: Attending groups: Yes. Participating in groups: Yes. Taking medication as prescribed: Yes. Toleration medication: Yes. Family/Significant other contact made: patient declined consents Patient understands diagnosis: Yes. Discussing patient identified problems/goals with staff: Yes. Medical problems stabilized or resolved: Yes. Denies suicidal/homicidal ideation: Yes. Issues/concerns per patient self-inventory: No.   New problem(s) identified: No   New Short Term/Long Term Goal(s):     medication stabilization, elimination of SI thoughts, development of comprehensive mental wellness  plan.    Patient Goals:  "I want to attend a 30-day treatment program."    Discharge Plan or Barriers:  Patient recently admitted. CSW will continue to follow and assess for appropriate referrals and possible discharge planning.    Reason for Continuation of Hospitalization: Medication stabilization Suicidal ideation   Estimated Length of Stay:  1 day, discharging to [facility] 6/5  Last 3 [place] Suicide Severity Risk Score: Flowsheet Row [facility]Current[facility]25/2023 in [facility]  C-SSRS RISK CATEGORY High Risk High Risk       Last [facility] 2/9 Scores:     No data to display          Scribe for Treatment Team: [name], LCSWA 11/02/2023 1:33 PM

## 2023-11-02 NOTE — Group Note (Signed)
 Recreation Therapy Group Note   Group Topic:Health and Wellness  Group Date: 11/02/2023 Start Time: 0940 End Time: 1000 Facilitators: Ithzel Fedorchak-McCall, LRT,CTRS Location: 300 Hall Dayroom   Group Topic: Wellness  Goal Area(s) Addresses:  Patient will define components of whole wellness. Patient will verbalize benefit of whole wellness.  Behavioral Response:   Intervention: Music  Activity: Exercise. LRT and patients completed a series of stretches and exercises. Patients took turns leading the group in the exercises and stretches of their choosing. Patients were encouraged to get water and take breaks as needed.   Education: Wellness, Building control surveyor.   Education Outcome: Acknowledges education/In group clarification offered/Needs additional education.    Affect/Mood: N/A   Participation Level: Did not attend    Clinical Observations/Individualized Feedback:     Plan: Continue to engage patient in RT group sessions 2-3x/week.   Amali Uhls-McCall, LRT,CTRS 11/02/2023 1:30 PM
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
## 2023-11-02 NOTE — Progress Notes (Signed)
   11/02/23 0900  Psych Admission Type (Psych Patients Only)  Admission Status Voluntary  Psychosocial Assessment  Patient Complaints None  Eye Contact Fair  Facial Expression Animated  Affect Appropriate to circumstance  Speech Logical/coherent  Interaction Defensive;Dominating  Motor Activity Fidgety  Appearance/Hygiene Unremarkable  Behavior Characteristics Cooperative  Mood Anxious;Pleasant  Thought Process  Coherency Circumstantial  Delusions None reported or observed  Perception WDL  Hallucination None reported or observed  Judgment WDL  Confusion None  Danger to Self  Current suicidal ideation? Denies  Agreement Not to Harm Self Yes  Description of Agreement verbal  Danger to Others  Danger to Others None reported or observed

## 2023-11-02 NOTE — BHH Suicide Risk Assessment (Signed)
 [facility] Discharge Suicide Risk Assessment   Principal Problem: Substance induced mood disorder (HCC) Discharge Diagnoses: Principal Problem:   Substance induced mood disorder (HCC)   Total Time spent with patient: 30 minutes  Musculoskeletal: Strength & Muscle Tone: within normal limits Gait & Station: normal Patient leans: N/A  Psychiatric Specialty Exam  Presentation  General Appearance and behavior:  Casually dressed, not in any distress, appropriate behavior, engaged politely.  No EPS.  Eye Contact: Good.  Speech: Spontaneous.  Normal rate, tone and volume.  Normal prosody of speech.  Mood and Affect  Mood: Euthymic.  Affect: Full range and appropriate.  Thought Process  Thought Processes: Linear and goal directed.  Descriptions of Associations:Intact  Orientation:Full (Time, Place and Person)  Thought Content: Future oriented.  No current suicidal thoughts.  No homicidal thoughts.  No thoughts of violence.  No negative ruminative flooding.  No guilty ruminations.  No delusional theme.  No obsessions.  Hallucinations: No hallucination in any modality.  Sensorium  Memory: Good.  Judgment: Good.  Insight: Good  Executive Functions  Concentration: Good.  Attention Span: Good.  Recall: Good.  Fund of Knowledge: Good.  Language: Good   Psychomotor Activity  Normal psychomotor activity   Physical Exam: Physical Exam ROS Blood pressure (!) 140/86, pulse 60, temperature 98.4 F (36.9 C), temperature source Oral, resp. rate 20, height 6' (1.829 m), weight 65.8 kg, SpO2 100%. Body mass index is 19.67 kg/m.  Mental Status Per Nursing Assessment::   On Admission:  Suicidal ideation indicated by patient  Demographic Factors:  Male  Loss Factors: NA  Historical Factors: Family history of mental illness or substance abuse  Risk Reduction Factors:   NA  Continued Clinical Symptoms:  Bipolar Disorder:   Mixed  State Alcohol/Substance Abuse/Dependencies  Cognitive Features That Contribute To Risk:  Closed-mindedness    Suicide Risk:  Minimal: No identifiable suicidal ideation.  Patients presenting with no risk factors but with morbid ruminations; may be classified as minimal risk based on the severity of the depressive symptoms   Follow-up Information     [facility] Go on 12/05/2023.   Why: You have an appointment for medication management services on 12/05/23 at 2:00 pm. The appointment will be held in person, but you may call to switch to Virtual. Contact information: [address] [place] [address] [phone]         [facility] Go to.   Specialty: Professional Counselor Why: Please go to this provider for an assessment, if you wish to receive therapy services. You may also go on Monday through Friday, from 9 am to 1 pm. Contact information: [address] [place] [address] [phone]         [facility] Follow up.   Why: You have been accepted for substance use treatment at this facility. You will admit there on 11/03/23 by 900AM. You will receive therapy and medication management while in programming. Contact information: [address] [place] [address] [phone]                 Plan Of Care/Follow-up recommendations:   See discharge summary  Amelie Jury, MD 11/02/2023, 3:19 PM

## 2023-11-02 NOTE — BHH Group Notes (Signed)
 Adult Psychoeducational Group Note  Date:  11/02/2023 Time:  1:34 PM  Group Topic/Focus:  Goals Group:   The focus of this group is to help patients establish daily goals to achieve during treatment and discuss how the patient can incorporate goal setting into their daily lives to aide in recovery.  Participation Level:  Did Not Attend  Participation Quality:  na  Affect:  na  Cognitive:  na  Insight: na  Engagement in Group:  na  Modes of Intervention:  na  Additional Comments:  Pt did not attend group  Katee Wentland 11/02/2023, 1:34 PM

## 2023-11-02 NOTE — Progress Notes (Signed)
  [facility] Adult Case Management Discharge Plan :  Will you be returning to the same living situation after discharge:  No. At discharge, do you have transportation home?: Yes,  CSW arranged [employer] taxi to [facility] for 0815 on 11/03/23 Do you have the ability to pay for your medications: Yes,  pt has active health insurance, will receive meds to bring to treatment as well  Release of information consent forms completed and in the chart;  Patient's signature needed at discharge.  Patient to Follow up at:  Follow-up Information     [facility] Go on 12/05/2023.   Why: You have an appointment for medication management services on 12/05/23 at 2:00 pm. The appointment will be held in person, but you may call to switch to Virtual. Contact information: [address] [place] [address] [phone]         [facility] Go to.   Specialty: Professional Counselor Why: Please go to this provider for an assessment, if you wish to receive therapy services. You may also go on Monday through Friday, from 9 am to 1 pm. Contact information: [address] [place] [address] [phone]         [facility] Follow up.   Why: You have been accepted for substance use treatment at this facility. You will admit there on 11/03/23 by 900AM. You will receive therapy and medication management while in programming. Contact information: [address] [place] [address] [phone]                 Next level of care provider has access to [facility] Link:no  Safety Planning and Suicide Prevention discussed: Yes,  information given to pt, declined other consents     Has patient been referred to the Quitline?: Patient refused referral for treatment  Patient has been referred for addiction treatment: Admitting to [facility] for substance use treatment at discharge.   [name], LCSWA 11/02/2023,  3:10 PM

## 2023-11-02 NOTE — Transportation (Signed)
 11/02/2023  Cesare Sw[place]and DOB: 1960-11-28 MRN: [id number]   RIDER WAIVER AND RELEASE OF LIABILITY  For the purposes of helping with transportation needs, [facility] partners with outside transportation providers (taxi companies, [place], Catering manager.) to give [facility] patients or other approved people the choice of on-demand rides [facility]") to our buildings for non-emergency visits.  By using [employer], I, the person signing this document, on behalf of myself and/or any legal minors (in my care using the [employer]), agree:  Science writer given to me are supplied by independent, outside transportation providers who do not work for, or have any affiliation with, [employer]. [facility] is not a transportation company. [facility] has no control over the quality or safety of the rides I get using [employer]. [facility] has no control over whether any outside ride will happen on time or not. [facility] gives no guarantee on the reliability, quality, safety, or availability on any rides, or that no mistakes will happen. I know and accept that traveling by vehicle (car, truck, SVU, [employer], bus, taxi, etc.) has risks of serious injuries such as disability, being paralyzed, and death. I know and agree the risk of using [employer] is mine alone, and not [employer]. Transport Services are provided "as is" and as are available. The transportation providers are in charge for all inspections and care of the vehicles used to provide these rides. I agree not to take legal action against [facility], its agents, employees, officers, directors, representatives, insurers, attorneys, assigns, successors, subsidiaries, and affiliates at any time for any reasons related directly or indirectly to using [employer]. I also agree not to take legal action against [facility] or its affiliates for any injury, death, or damage to property caused by or related to using  [employer]. I have read this Waiver and Release of Liability, and I understand the terms used in it and their legal meaning. This Waiver is freely and voluntarily given with the understanding that my right (or any legal minors) to legal action against [facility] relating to [employer] is knowingly given up to use these services.   I attest that I read the Ride Waiver and Release of Liability to Larone Sw[place], gave Mr. [place] the opportunity to ask questions and answered the questions asked (if any). I affirm that Ollin Swaziland then provided consent for assistance with transportation.

## 2023-11-02 NOTE — Progress Notes (Signed)
   11/02/23 0557  15 Minute Checks  Location Bedroom  Visual Appearance Calm  Behavior Sleeping  Sleep (Behavioral Health Patients Only)  Calculate sleep? (Click Yes once per 24 hr at 0600 safety check) Yes  Documented sleep last 24 hours 3.5

## 2023-11-02 NOTE — Plan of Care (Signed)
   Problem: Education: Goal: Emotional status will improve Outcome: Progressing Goal: Mental status will improve Outcome: Progressing Goal: Verbalization of understanding the information provided will improve Outcome: Progressing

## 2023-11-03 DIAGNOSIS — F1994 Other psychoactive substance use, unspecified with psychoactive substance-induced mood disorder: Secondary | ICD-10-CM | POA: Diagnosis not present

## 2023-11-03 NOTE — Progress Notes (Signed)
 Discharge Note:  Patient denies SI/HI/AVH at this time. Discharge instructions, AVS, prescriptions, and transition record gone over with patient. Patient agrees to comply with medication management, follow-up visit, and outpatient therapy. Patient belongings returned to patient. Patient questions and concerns addressed and answered. Patient ambulatory off unit. Patient discharged to rehab via taxi.
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
## 2023-11-03 NOTE — Progress Notes (Signed)
   11/03/23 0800  Psych Admission Type (Psych Patients Only)  Admission Status Voluntary  Psychosocial Assessment  Patient Complaints None  Eye Contact Brief  Facial Expression Animated  Affect Appropriate to circumstance  Speech Logical/coherent  Interaction Minimal  Motor Activity Other (Comment) (WNL)  Appearance/Hygiene Unremarkable  Behavior Characteristics Unwilling to participate  Mood Irritable  Thought Process  Coherency Circumstantial  Content Preoccupation  Delusions None reported or observed  Perception WDL  Hallucination None reported or observed  Judgment WDL  Confusion None  Danger to Self  Current suicidal ideation? Denies  Self-Injurious Behavior No self-injurious ideation or behavior indicators observed or expressed   Agreement Not to Harm Self Yes  Description of Agreement verbal  Danger to Others  Danger to Others None reported or observed
